# Patient Record
Sex: Male | Born: 1970 | Hispanic: Yes | Marital: Married | Smoking: Never smoker
Health system: Southern US, Community
[De-identification: ages and names within clinical notes are randomized; demographics above are authoritative.]

## PROBLEM LIST (undated history)

## (undated) DIAGNOSIS — M109 Gout, unspecified: Secondary | ICD-10-CM

## (undated) DIAGNOSIS — I10 Essential (primary) hypertension: Secondary | ICD-10-CM

## (undated) DIAGNOSIS — E785 Hyperlipidemia, unspecified: Secondary | ICD-10-CM

## (undated) DIAGNOSIS — E119 Type 2 diabetes mellitus without complications: Secondary | ICD-10-CM

## (undated) HISTORY — PX: HERNIA REPAIR: SHX51

---

## 2017-02-22 ENCOUNTER — Encounter (HOSPITAL_BASED_OUTPATIENT_CLINIC_OR_DEPARTMENT_OTHER): Payer: Self-pay | Admitting: *Deleted

## 2017-02-22 ENCOUNTER — Observation Stay (HOSPITAL_BASED_OUTPATIENT_CLINIC_OR_DEPARTMENT_OTHER)
Admission: EM | Admit: 2017-02-22 | Discharge: 2017-02-24 | Disposition: A | Discharge status: Home / Self Care | Payer: Medicare Other | Attending: Internal Medicine | Admitting: Internal Medicine

## 2017-02-22 ENCOUNTER — Emergency Department (HOSPITAL_BASED_OUTPATIENT_CLINIC_OR_DEPARTMENT_OTHER): Payer: Medicare Other

## 2017-02-22 DIAGNOSIS — K4030 Unilateral inguinal hernia, with obstruction, without gangrene, not specified as recurrent: Secondary | ICD-10-CM | POA: Diagnosis not present

## 2017-02-22 DIAGNOSIS — N10 Acute tubulo-interstitial nephritis: Secondary | ICD-10-CM | POA: Diagnosis present

## 2017-02-22 DIAGNOSIS — Z79899 Other long term (current) drug therapy: Secondary | ICD-10-CM | POA: Diagnosis not present

## 2017-02-22 DIAGNOSIS — I2510 Atherosclerotic heart disease of native coronary artery without angina pectoris: Secondary | ICD-10-CM | POA: Diagnosis not present

## 2017-02-22 DIAGNOSIS — N136 Pyonephrosis: Secondary | ICD-10-CM | POA: Diagnosis not present

## 2017-02-22 DIAGNOSIS — T83592A Infection and inflammatory reaction due to indwelling ureteral stent, initial encounter: Secondary | ICD-10-CM | POA: Diagnosis not present

## 2017-02-22 DIAGNOSIS — R109 Unspecified abdominal pain: Secondary | ICD-10-CM | POA: Diagnosis present

## 2017-02-22 DIAGNOSIS — N281 Cyst of kidney, acquired: Secondary | ICD-10-CM | POA: Insufficient documentation

## 2017-02-22 DIAGNOSIS — Y848 Other medical procedures as the cause of abnormal reaction of the patient, or of later complication, without mention of misadventure at the time of the procedure: Secondary | ICD-10-CM | POA: Diagnosis not present

## 2017-02-22 DIAGNOSIS — N179 Acute kidney failure, unspecified: Secondary | ICD-10-CM | POA: Diagnosis not present

## 2017-02-22 DIAGNOSIS — Z7902 Long term (current) use of antithrombotics/antiplatelets: Secondary | ICD-10-CM | POA: Diagnosis not present

## 2017-02-22 DIAGNOSIS — E119 Type 2 diabetes mellitus without complications: Secondary | ICD-10-CM | POA: Diagnosis not present

## 2017-02-22 DIAGNOSIS — E785 Hyperlipidemia, unspecified: Secondary | ICD-10-CM | POA: Diagnosis not present

## 2017-02-22 DIAGNOSIS — N2889 Other specified disorders of kidney and ureter: Principal | ICD-10-CM | POA: Insufficient documentation

## 2017-02-22 DIAGNOSIS — N12 Tubulo-interstitial nephritis, not specified as acute or chronic: Secondary | ICD-10-CM

## 2017-02-22 DIAGNOSIS — I10 Essential (primary) hypertension: Secondary | ICD-10-CM | POA: Diagnosis not present

## 2017-02-22 DIAGNOSIS — M109 Gout, unspecified: Secondary | ICD-10-CM | POA: Insufficient documentation

## 2017-02-22 DIAGNOSIS — Z794 Long term (current) use of insulin: Secondary | ICD-10-CM | POA: Insufficient documentation

## 2017-02-22 HISTORY — DX: Gout, unspecified: M10.9

## 2017-02-22 HISTORY — DX: Type 2 diabetes mellitus without complications: E11.9

## 2017-02-22 HISTORY — DX: Essential (primary) hypertension: I10

## 2017-02-22 HISTORY — DX: Hyperlipidemia, unspecified: E78.5

## 2017-02-22 LAB — CBC WITH DIFFERENTIAL/PLATELET
BASOS ABS: 0 10*3/uL (ref 0.0–0.1)
BASOS PCT: 0 %
EOS ABS: 0.1 10*3/uL (ref 0.0–0.7)
EOS PCT: 1 %
HCT: 37.9 % — ABNORMAL LOW (ref 39.0–52.0)
Hemoglobin: 12.2 g/dL — ABNORMAL LOW (ref 13.0–17.0)
Lymphocytes Relative: 34 %
Lymphs Abs: 3 10*3/uL (ref 0.7–4.0)
MCH: 28.6 pg (ref 26.0–34.0)
MCHC: 32.2 g/dL (ref 30.0–36.0)
MCV: 89 fL (ref 78.0–100.0)
MONO ABS: 0.6 10*3/uL (ref 0.1–1.0)
Monocytes Relative: 6 %
Neutro Abs: 5.2 10*3/uL (ref 1.7–7.7)
Neutrophils Relative %: 59 %
Platelets: 153 10*3/uL (ref 150–400)
RBC: 4.26 MIL/uL (ref 4.22–5.81)
RDW: 13.1 % (ref 11.5–15.5)
WBC: 8.8 10*3/uL (ref 4.0–10.5)

## 2017-02-22 LAB — BASIC METABOLIC PANEL
ANION GAP: 9 (ref 5–15)
BUN: 32 mg/dL — ABNORMAL HIGH (ref 6–20)
CALCIUM: 8.9 mg/dL (ref 8.9–10.3)
CHLORIDE: 105 mmol/L (ref 101–111)
CO2: 26 mmol/L (ref 22–32)
Creatinine, Ser: 1.38 mg/dL — ABNORMAL HIGH (ref 0.61–1.24)
GFR calc Af Amer: >60 mL/min (ref >60)
GFR calc non Af Amer: 60 mL/min — ABNORMAL LOW (ref >60)
Glucose, Bld: 111 mg/dL — ABNORMAL HIGH (ref 65–99)
Potassium: 3.9 mmol/L (ref 3.5–5.1)
Sodium: 140 mmol/L (ref 135–145)

## 2017-02-22 LAB — URINALYSIS, ROUTINE W REFLEX MICROSCOPIC
Bilirubin Urine: NEGATIVE
Glucose, UA: NEGATIVE mg/dL
Ketones, ur: NEGATIVE mg/dL
Nitrite: POSITIVE — AB
PROTEIN: NEGATIVE mg/dL
SPECIFIC GRAVITY, URINE: 1.017 (ref 1.005–1.030)
pH: 6 (ref 5.0–8.0)

## 2017-02-22 LAB — URINALYSIS, MICROSCOPIC (REFLEX)

## 2017-02-22 MED ORDER — SODIUM CHLORIDE 0.9 % IV BOLUS (SEPSIS)
1000.0000 mL | Freq: Once | INTRAVENOUS | Status: AC
  Administered 2017-02-22: 1000 mL via INTRAVENOUS

## 2017-02-22 MED ORDER — FENTANYL CITRATE (PF) 100 MCG/2ML IJ SOLN
50.0000 ug | INTRAMUSCULAR | Status: DC | PRN
  Administered 2017-02-22: 50 ug via INTRAVENOUS

## 2017-02-22 MED ORDER — DEXTROSE 5 % IV SOLN
1.0000 g | Freq: Once | INTRAVENOUS | Status: AC
  Administered 2017-02-22: 1 g via INTRAVENOUS
  Filled 2017-02-22: qty 10

## 2017-02-22 MED ORDER — SODIUM CHLORIDE 0.9 % IV BOLUS (SEPSIS)
500.0000 mL | Freq: Once | INTRAVENOUS | Status: AC
  Administered 2017-02-23: 500 mL via INTRAVENOUS

## 2017-02-22 MED ORDER — FENTANYL CITRATE (PF) 100 MCG/2ML IJ SOLN
INTRAMUSCULAR | Status: AC
  Filled 2017-02-22: qty 2

## 2017-02-22 MED ORDER — KETOROLAC TROMETHAMINE 30 MG/ML IJ SOLN
30.0000 mg | Freq: Once | INTRAMUSCULAR | Status: AC
  Administered 2017-02-22: 30 mg via INTRAVENOUS
  Filled 2017-02-22: qty 1

## 2017-02-22 NOTE — ED Provider Notes (Signed)
[facility] Provider Note   CSN: [id number] Arrival date & time: 02/22/17  2142  By signing my name below, I, Nelwyn Salisbury, attest that this documentation has been prepared under the direction and in the presence of Tenlee Wollin, MD . Electronically Signed: Nelwyn Salisbury, Scribe. 02/22/2017. 11:12 PM.  History   Chief Complaint Chief Complaint  Patient presents with  . Flank Pain   The history is provided by the patient and a relative. The history is limited by a language barrier. No language interpreter was used.  Flank Pain  This is a new problem. The current episode started 2 days ago. The problem occurs constantly. The problem has not changed since onset.Exacerbated by: Palpation. Nothing relieves the symptoms. He has tried nothing for the symptoms. The treatment provided no relief.    HPI Comments:  Cory Copeland is a 46 y.o. male with pmhx of DM, HLD, and HTN who presents to the Emergency Department complaining of constant, moderate right-sided flank pain onset two days ago. His pain is worsened with palpation. He reports associated dysuria. No medications taken PTA. Denies any vomiting, diarrhea, constipation or fever.    Past Medical History:  Diagnosis Date  . Diabetes mellitus without complication (HCC)   . Gout   . Hyperlipidemia   . Hypertension     There are no active problems to display for this patient.   History reviewed. No pertinent surgical history.     Home Medications    Prior to Admission medications   Medication Sig Start Date End Date Taking? Authorizing Provider  ALLOPURINOL PO Take by mouth.   Yes Historical Provider, MD  citalopram (CELEXA) 40 MG tablet Take 40 mg by mouth daily.   Yes Historical Provider, MD  clopidogrel (PLAVIX) 75 MG tablet Take 75 mg by mouth daily.   Yes Historical Provider, MD  enalapril (VASOTEC) 20 MG tablet Take 20 mg by mouth daily.   Yes Historical Provider, MD  gemfibrozil (LOPID) 600 MG tablet  Take 600 mg by mouth 2 (two) times daily before a meal.   Yes Historical Provider, MD  glimepiride (AMARYL) 4 MG tablet Take 4 mg by mouth daily with breakfast.   Yes Historical Provider, MD  hydrochlorothiazide (HYDRODIURIL) 25 MG tablet Take 25 mg by mouth daily.   Yes Historical Provider, MD  indomethacin (INDOCIN) 50 MG capsule Take 50 mg by mouth 2 (two) times daily with a meal.   Yes Historical Provider, MD  insulin glargine (LANTUS) 100 UNIT/ML injection Inject 50 Units into the skin at bedtime.   Yes Historical Provider, MD  isosorbide dinitrate (ISORDIL) 30 MG tablet Take 30 mg by mouth 4 (four) times daily.   Yes Historical Provider, MD  metformin (FORTAMET) 1000 MG (OSM) 24 hr tablet Take 1,000 mg by mouth daily with breakfast.   Yes Historical Provider, MD  metoprolol (LOPRESSOR) 50 MG tablet Take 50 mg by mouth 2 (two) times daily.   Yes Historical Provider, MD  temazepam (RESTORIL) 15 MG capsule Take 30 mg by mouth at bedtime as needed for sleep.   Yes Historical Provider, MD    Family History History reviewed. No pertinent family history.  Social History Social History  Substance Use Topics  . Smoking status: Never Smoker  . Smokeless tobacco: Not on file  . Alcohol use No     Allergies   Patient has no known allergies.   Review of Systems Review of Systems  Constitutional: Negative for fever.  Gastrointestinal: Negative  for constipation, diarrhea and vomiting.  Genitourinary: Positive for flank pain. Negative for dysuria, enuresis and frequency.  All other systems reviewed and are negative.    Physical Exam Updated Vital Signs BP (!) 157/82 (BP Location: Left Arm)   Pulse 92   Temp 97.6 F (36.4 C) (Oral)   Resp 18   Ht  (1.702 m)   Wt 262 lb (118.8 kg)   SpO2 96%   BMI 41.04 kg/m   Physical Exam  Constitutional: He is oriented to person, place, and time. He appears well-developed and well-nourished. No distress.  HENT:  Head: Normocephalic and  atraumatic.  Mouth/Throat: Oropharynx is clear and moist. No oropharyngeal exudate.  Moist mucous membranes   Eyes: Conjunctivae are normal. Pupils are equal, round, and reactive to light.  Neck: Normal range of motion. Neck supple. Carotid bruit is not present.  Trachea midline No bruit  Cardiovascular: Normal rate, regular rhythm, normal heart sounds and intact distal pulses.   Pulmonary/Chest: Effort normal and breath sounds normal. No stridor. No respiratory distress.  Abdominal: Soft. Bowel sounds are normal. He exhibits no distension and no mass. There is no tenderness. There is no rebound and no guarding.  Musculoskeletal: Normal range of motion. He exhibits no edema.  Neurological: He is alert and oriented to person, place, and time. He has normal reflexes.  Skin: Skin is warm and dry. Capillary refill takes less than 2 seconds.  Psychiatric: He has a normal mood and affect. His behavior is normal.  Nursing note and vitals reviewed.   ED Treatments / Results   Vitals:   02/23/17 0200 02/23/17 0245  BP: 126/72 140/68  Pulse: 71 73  Resp:    Temp:     Results for orders placed or performed during the hospital encounter of 02/22/17  Urinalysis, Routine w reflex microscopic  Result Value Ref Range   Color, Urine YELLOW YELLOW   APPearance CLOUDY (A) CLEAR   Specific Gravity, Urine 1.017 1.005 - 1.030   pH 6.0 5.0 - 8.0   Glucose, UA NEGATIVE NEGATIVE mg/dL   Hgb urine dipstick SMALL (A) NEGATIVE   Bilirubin Urine NEGATIVE NEGATIVE   Ketones, ur NEGATIVE NEGATIVE mg/dL   Protein, ur NEGATIVE NEGATIVE mg/dL   Nitrite POSITIVE (A) NEGATIVE   Leukocytes, UA LARGE (A) NEGATIVE  Urinalysis, Microscopic (reflex)  Result Value Ref Range   RBC / HPF 0-5 0 - 5 RBC/hpf   WBC, UA TOO NUMEROUS TO COUNT 0 - 5 WBC/hpf   Bacteria, UA FEW (A) NONE SEEN   Squamous Epithelial / LPF 0-5 (A) NONE SEEN  CBC with Differential/Platelet  Result Value Ref Range   WBC 8.8 4.0 - 10.5 K/uL    RBC 4.26 4.22 - 5.81 MIL/uL   Hemoglobin 12.2 (L) 13.0 - 17.0 g/dL   HCT 40.9 (L) 81.1 - 91.4 %   MCV 89.0 78.0 - 100.0 fL   MCH 28.6 26.0 - 34.0 pg   MCHC 32.2 30.0 - 36.0 g/dL   RDW 78.2 95.6 - 21.3 %   Platelets 153 150 - 400 K/uL   Neutrophils Relative % 59 %   Neutro Abs 5.2 1.7 - 7.7 K/uL   Lymphocytes Relative 34 %   Lymphs Abs 3.0 0.7 - 4.0 K/uL   Monocytes Relative 6 %   Monocytes Absolute 0.6 0.1 - 1.0 K/uL   Eosinophils Relative 1 %   Eosinophils Absolute 0.1 0.0 - 0.7 K/uL   Basophils Relative 0 %   Basophils  Absolute 0.0 0.0 - 0.1 K/uL  Basic metabolic panel  Result Value Ref Range   Sodium 140 135 - 145 mmol/L   Potassium 3.9 3.5 - 5.1 mmol/L   Chloride 105 101 - 111 mmol/L   CO2 26 22 - 32 mmol/L   Glucose, Bld 111 (H) 65 - 99 mg/dL   BUN 32 (H) 6 - 20 mg/dL   Creatinine, Ser 8.29 (H) 0.61 - 1.24 mg/dL   Calcium 8.9 8.9 - 56.2 mg/dL   GFR calc non Af Amer 60 (L) >60 mL/min   GFR calc Af Amer >60 >60 mL/min   Anion gap 9 5 - 15   Ct Renal Stone Study  Result Date: 02/22/2017 CLINICAL DATA:  Acute onset of right flank pain.  Initial encounter. EXAM: CT ABDOMEN AND PELVIS WITHOUT CONTRAST TECHNIQUE: Multidetector CT imaging of the abdomen and pelvis was performed following the standard protocol without IV contrast. COMPARISON:  None. FINDINGS: Lower chest: The visualized lung bases are grossly clear. The visualized portions of the mediastinum are unremarkable. Hepatobiliary: The liver is unremarkable in appearance. The gallbladder is unremarkable in appearance. The common bile duct remains normal in caliber. Pancreas: The pancreas is within normal limits. Spleen: The spleen is unremarkable in appearance. Adrenals/Urinary Tract: The adrenal glands are unremarkable in appearance. The kidneys are within normal limits. There is no evidence of hydronephrosis. No renal or ureteral stones are identified. No perinephric stranding is seen. Moderate right-sided renal atrophy is  noted, with underlying moderate chronic right-sided hydronephrosis. A right ureteral stent has migrated distally into a tortuous right ureter, with surrounding soft tissue inflammation. This results in stretching and bowing of the ureter, and likely causes the patient's symptoms. The distal aspect of the stent extends into the prostatic urethra. Mild nonspecific left-sided perinephric stranding is noted. A left renal cyst is identified. No nonobstructing renal stones are seen. Stomach/Bowel: The stomach is unremarkable in appearance. The small bowel is within normal limits. The appendix is normal in caliber, without evidence of appendicitis. The colon is unremarkable in appearance. Vascular/Lymphatic: The abdominal aorta is unremarkable in appearance. The inferior vena cava is grossly unremarkable. No retroperitoneal lymphadenopathy is seen. No pelvic sidewall lymphadenopathy is identified. Reproductive: The prostate remains normal in size. The bladder is grossly unremarkable in appearance. As mentioned above, the distal aspect of the patient's right ureteral stent extends into the prostatic urethra. Other: A moderate left inguinal hernia is seen, containing only fat. Musculoskeletal: No acute osseous abnormalities are identified. The visualized musculature is unremarkable in appearance. IMPRESSION: 1. Patient's right ureteral stent has migrated distally into a tortuous right ureter, with surrounding diffuse soft tissue inflammation. This has resulted in stretching and marked bowing of the ureter, and likely causes the patient's symptoms. The distal aspect of the stent now extends into the prostatic urethra. 2. Underlying moderate chronic right-sided hydronephrosis, with associated moderate right-sided renal atrophy. 3. Left renal cyst noted. 4. Moderate left inguinal hernia, containing only fat. These results were called by telephone at the time of interpretation on 02/22/2017 at 11:37 pm to Dr. Cy Blamer, who  verbally acknowledged these results. Electronically Signed   By: Roanna Raider M.D.   On: 02/22/2017 23:38     DIAGNOSTIC STUDIES:  Oxygen Saturation is 96% on RA, normal by my interpretation.    COORDINATION OF CARE:  11:08 PM Discussed treatment plan with pt at bedside and pt agreed to plan.  11:36 PM  Informed by radiology that the pt has an infected  ureteral stent  Radiology No results found.  Procedures Procedures (including critical care time)  Medications Ordered in ED  Medications  fentaNYL (SUBLIMAZE) injection 50 mcg (50 mcg Intravenous Given 02/22/17 2301)  tamsulosin (FLOMAX) capsule 0.4 mg (0.4 mg Oral Given 02/23/17 0151)  sodium chloride 0.9 % bolus 1,000 mL (0 mLs Intravenous Stopped 02/23/17 0032)  cefTRIAXone (ROCEPHIN) 1 g in dextrose 5 % 50 mL IVPB (0 g Intravenous Stopped 02/22/17 2334)  ketorolac (TORADOL) 30 MG/ML injection 30 mg (30 mg Intravenous Given 02/22/17 2358)  sodium chloride 0.9 % bolus 500 mL (0 mLs Intravenous Stopped 02/23/17 0101)    Case d/w Dr. Laverle Patter please admit to medicine they can consult, stent can be removed on a non emergent basis.    Final Clinical Impressions(s) / ED Diagnoses  Ureteritis infected stent: admit to medicine, Dr. Katrinka Blazing I personally performed the services described in this documentation, which was scribed in my presence. The recorded information has been reviewed and is accurate.       Cy Blamer, MD 02/23/17 [id number]

## 2017-02-22 NOTE — ED Triage Notes (Signed)
Pt c/o right flank pain x 2 days.  

## 2017-02-22 NOTE — ED Notes (Signed)
Patient transported to CT 

## 2017-02-23 ENCOUNTER — Encounter (HOSPITAL_COMMUNITY): Payer: Self-pay

## 2017-02-23 DIAGNOSIS — N12 Tubulo-interstitial nephritis, not specified as acute or chronic: Secondary | ICD-10-CM

## 2017-02-23 DIAGNOSIS — R109 Unspecified abdominal pain: Secondary | ICD-10-CM | POA: Diagnosis not present

## 2017-02-23 DIAGNOSIS — N390 Urinary tract infection, site not specified: Secondary | ICD-10-CM | POA: Insufficient documentation

## 2017-02-23 DIAGNOSIS — N10 Acute tubulo-interstitial nephritis: Secondary | ICD-10-CM | POA: Diagnosis present

## 2017-02-23 LAB — CBC
HCT: 36.8 % — ABNORMAL LOW (ref 39.0–52.0)
HEMOGLOBIN: 11.6 g/dL — AB (ref 13.0–17.0)
MCH: 27.2 pg (ref 26.0–34.0)
MCHC: 31.5 g/dL (ref 30.0–36.0)
MCV: 86.4 fL (ref 78.0–100.0)
Platelets: 122 10*3/uL — ABNORMAL LOW (ref 150–400)
RBC: 4.26 MIL/uL (ref 4.22–5.81)
RDW: 13.2 % (ref 11.5–15.5)
WBC: 5.4 10*3/uL (ref 4.0–10.5)

## 2017-02-23 LAB — HIV ANTIBODY (ROUTINE TESTING W REFLEX): HIV Screen 4th Generation wRfx: NONREACTIVE

## 2017-02-23 MED ORDER — SENNA 8.6 MG PO TABS
1.0000 | ORAL_TABLET | Freq: Two times a day (BID) | ORAL | Status: DC
  Administered 2017-02-23 (×2): 8.6 mg via ORAL
  Filled 2017-02-23 (×3): qty 1

## 2017-02-23 MED ORDER — ACETAMINOPHEN 650 MG RE SUPP: 650.0000 mg | Freq: Four times a day (QID) | RECTAL | Status: DC | PRN

## 2017-02-23 MED ORDER — ISOSORBIDE DINITRATE 30 MG PO TABS: 30.0000 mg | ORAL_TABLET | Freq: Four times a day (QID) | ORAL | Status: DC

## 2017-02-23 MED ORDER — GEMFIBROZIL 600 MG PO TABS
600.0000 mg | ORAL_TABLET | Freq: Two times a day (BID) | ORAL | Status: DC
  Administered 2017-02-23 – 2017-02-24 (×3): 600 mg via ORAL
  Filled 2017-02-23 (×3): qty 1

## 2017-02-23 MED ORDER — ONDANSETRON HCL 4 MG PO TABS: 4.0000 mg | ORAL_TABLET | Freq: Four times a day (QID) | ORAL | Status: DC | PRN

## 2017-02-23 MED ORDER — MORPHINE SULFATE (PF) 4 MG/ML IV SOLN: 1.0000 mg | INTRAVENOUS | Status: DC | PRN

## 2017-02-23 MED ORDER — METOPROLOL TARTRATE 50 MG PO TABS
50.0000 mg | ORAL_TABLET | Freq: Two times a day (BID) | ORAL | Status: DC
  Administered 2017-02-23 – 2017-02-24 (×3): 50 mg via ORAL
  Filled 2017-02-23 (×3): qty 1

## 2017-02-23 MED ORDER — ALLOPURINOL 100 MG PO TABS
100.0000 mg | ORAL_TABLET | Freq: Every day | ORAL | Status: DC
Start: 2017-02-23 — End: 2017-02-24
  Administered 2017-02-23 – 2017-02-24 (×2): 100 mg via ORAL
  Filled 2017-02-23 (×2): qty 1

## 2017-02-23 MED ORDER — CITALOPRAM HYDROBROMIDE 20 MG PO TABS
40.0000 mg | ORAL_TABLET | Freq: Every day | ORAL | Status: DC
  Administered 2017-02-23 – 2017-02-24 (×2): 40 mg via ORAL
  Filled 2017-02-23 (×2): qty 2

## 2017-02-23 MED ORDER — HYDROCODONE-ACETAMINOPHEN 5-325 MG PO TABS
1.0000 | ORAL_TABLET | ORAL | Status: DC | PRN
  Administered 2017-02-23: 2 via ORAL
  Filled 2017-02-23: qty 2

## 2017-02-23 MED ORDER — ISOSORBIDE DINITRATE 30 MG PO TABS
30.0000 mg | ORAL_TABLET | Freq: Two times a day (BID) | ORAL | Status: DC
  Administered 2017-02-23 – 2017-02-24 (×3): 30 mg via ORAL
  Filled 2017-02-23 (×4): qty 1

## 2017-02-23 MED ORDER — TAMSULOSIN HCL 0.4 MG PO CAPS
0.4000 mg | ORAL_CAPSULE | Freq: Every day | ORAL | Status: DC
  Administered 2017-02-23 – 2017-02-24 (×3): 0.4 mg via ORAL
  Filled 2017-02-23 (×3): qty 1

## 2017-02-23 MED ORDER — ENOXAPARIN SODIUM 40 MG/0.4ML ~~LOC~~ SOLN
40.0000 mg | SUBCUTANEOUS | Status: DC
  Administered 2017-02-23 – 2017-02-24 (×2): 40 mg via SUBCUTANEOUS
  Filled 2017-02-23 (×2): qty 0.4

## 2017-02-23 MED ORDER — INSULIN GLARGINE 100 UNIT/ML ~~LOC~~ SOLN
30.0000 [IU] | Freq: Every day | SUBCUTANEOUS | Status: DC
  Administered 2017-02-23: 30 [IU] via SUBCUTANEOUS
  Filled 2017-02-23 (×2): qty 0.3

## 2017-02-23 MED ORDER — TEMAZEPAM 15 MG PO CAPS: 30.0000 mg | ORAL_CAPSULE | Freq: Every evening | ORAL | Status: DC | PRN

## 2017-02-23 MED ORDER — ONDANSETRON HCL 4 MG/2ML IJ SOLN: 4.0000 mg | Freq: Four times a day (QID) | INTRAMUSCULAR | Status: DC | PRN

## 2017-02-23 MED ORDER — ACETAMINOPHEN 325 MG PO TABS: 650.0000 mg | ORAL_TABLET | Freq: Four times a day (QID) | ORAL | Status: DC | PRN

## 2017-02-23 MED ORDER — DOCUSATE SODIUM 100 MG PO CAPS
100.0000 mg | ORAL_CAPSULE | Freq: Two times a day (BID) | ORAL | Status: DC
  Administered 2017-02-23 – 2017-02-24 (×3): 100 mg via ORAL
  Filled 2017-02-23 (×3): qty 1

## 2017-02-23 MED ORDER — SODIUM CHLORIDE 0.9 % IV SOLN
INTRAVENOUS | Status: DC
  Administered 2017-02-23: 100 mL/h via INTRAVENOUS
  Administered 2017-02-23 – 2017-02-24 (×2): via INTRAVENOUS

## 2017-02-23 MED ORDER — DEXTROSE 5 % IV SOLN
2.0000 g | INTRAVENOUS | Status: DC
  Administered 2017-02-23 – 2017-02-24 (×2): 2 g via INTRAVENOUS
  Filled 2017-02-23 (×2): qty 2

## 2017-02-23 NOTE — Progress Notes (Addendum)
Mr. Cory Copeland is a 46 year old immigrant with pmh HTN, DM type 2, nephrolithiasis; who presented with right flank pain x 2 days. VS stable. BUN 32, Cr 1.38. UA +. CT abd shows displaced right ureteral stent with hydronephrosis. Patient placed on IV Rocephin.  Neurology called, but recommended treatment of an infection first and to consult in a.m. Transferring to MedSurg bed for treatment of pyelonephritis.

## 2017-02-23 NOTE — H&P (Signed)
History and Physical    [name] EYC:[id number] DOB: 1970/11/21 DOA: 02/22/2017  PCP: Pcp Not In System  Patient coming from:   I have personally briefly reviewed patient's old medical records in [facility]  Chief Complaint: right side flank pain, dysuria.   HPI: Cory Copeland is a 46 y.o. male with medical history significant of HTN, DM, Nephrolithiasis , prior ureter stent placement in [place] after a trauma to his kidney ? ( Kidney had dropped down and stent was placed to back it up), who presents complaining of flank pain, sharp I quality. 8/10, intermittent. He also report dysuria. Last dose of plavix was 4-13.  ED Course: WBC normal, afebrile, UA with : Too numerous to count WBC, small hemoglobin, positive nitrates, Cr at 1.3.   Review of Systems: As per HPI otherwise 10 point review of systems negative.    Past Medical History:  Diagnosis Date  . Diabetes mellitus without complication (HCC)   . Gout   . Hyperlipidemia   . Hypertension     PSH;  Stent placement ureter.  Inguinal hernia repair.    reports that he has never smoked. He does not have any smokeless tobacco history on file. He reports that he does not drink alcohol or use drugs.  No Known Allergies  Family History;  Parents deceased. History of HTN, DM.   Prior to Admission medications   Medication Sig Start Date End Date Taking? Authorizing Provider  allopurinol (ZYLOPRIM) 100 MG tablet Take 100 mg by mouth daily.   Yes Historical Provider, MD  citalopram (CELEXA) 40 MG tablet Take 40 mg by mouth daily.   Yes Historical Provider, MD  clopidogrel (PLAVIX) 75 MG tablet Take 75 mg by mouth daily.   Yes Historical Provider, MD  enalapril (VASOTEC) 20 MG tablet Take 20 mg by mouth daily.   Yes Historical Provider, MD  gemfibrozil (LOPID) 600 MG tablet Take 600 mg by mouth 2 (two) times daily before a meal.   Yes Historical Provider, MD  glimepiride (AMARYL) 4 MG tablet Take 4 mg by mouth daily  with breakfast.   Yes Historical Provider, MD  hydrochlorothiazide (HYDRODIURIL) 25 MG tablet Take 25 mg by mouth daily.   Yes Historical Provider, MD  indomethacin (INDOCIN) 50 MG capsule Take 50 mg by mouth 2 (two) times daily with a meal.   Yes Historical Provider, MD  insulin glargine (LANTUS) 100 UNIT/ML injection Inject 50 Units into the skin at bedtime.   Yes Historical Provider, MD  isosorbide dinitrate (ISORDIL) 30 MG tablet Take 30 mg by mouth 4 (four) times daily.   Yes Historical Provider, MD  metformin (FORTAMET) 1000 MG (OSM) 24 hr tablet Take 1,000 mg by mouth daily with breakfast.   Yes Historical Provider, MD  metoprolol (LOPRESSOR) 50 MG tablet Take 50 mg by mouth 2 (two) times daily.   Yes Historical Provider, MD  temazepam (RESTORIL) 30 MG capsule Take 30 mg by mouth at bedtime as needed for sleep. 02/04/17  Yes Historical Provider, MD    Physical Exam: Vitals:   02/23/17 0153 02/23/17 0200 02/23/17 0245 02/23/17 0411  BP: 133/67 126/72 140/68 137/67  Pulse: 75 71 73 80  Resp: 16   16  Temp: 97.7 F (36.5 C)   97.9 F (36.6 C)  TempSrc: Oral   Oral  SpO2: 97% 96% 93% 94%  Weight:    122.5 kg (270 lb 1 oz)  Height:     (1.702 m)  Constitutional: NAD, calm, comfortable, obese Vitals:   02/23/17 0153 02/23/17 0200 02/23/17 0245 02/23/17 0411  BP: 133/67 126/72 140/68 137/67  Pulse: 75 71 73 80  Resp: 16   16  Temp: 97.7 F (36.5 C)   97.9 F (36.6 C)  TempSrc: Oral   Oral  SpO2: 97% 96% 93% 94%  Weight:    122.5 kg (270 lb 1 oz)  Height:     (1.702 m)   Eyes: PERRL, lids and conjunctivae normal ENMT: Mucous membranes are moist. Posterior pharynx clear of any exudate or lesions.Normal dentition.  Neck: normal, supple, no masses, no thyromegaly Respiratory: clear to auscultation bilaterally, no wheezing, no crackles. Normal respiratory effort. No accessory muscle use.  Cardiovascular: Regular rate and rhythm, no murmurs / rubs / gallops. No  extremity edema. 2+ pedal pulses. No carotid bruits.  Abdomen: no tenderness, no masses palpated. No hepatosplenomegaly. Bowel sounds positive. Flank tenderness.  Musculoskeletal: no clubbing / cyanosis. No joint deformity upper and lower extremities. Good ROM, no contractures. Normal muscle tone.  Skin: no rashes, lesions, ulcers. No induration Neurologic: CN 2-12 grossly intact. Sensation intact, DTR normal. Strength 5/5 in all 4.  Psychiatric: Normal judgment and insight. Alert and oriented x 3. Normal mood.     Labs on Admission: I have personally reviewed following labs and imaging studies  CBC:  Recent Labs Lab 02/22/17 2318  WBC 8.8  NEUTROABS 5.2  HGB 12.2*  HCT 37.9*  MCV 89.0  PLT 153   Basic Metabolic Panel:  Recent Labs Lab 02/22/17 2318  NA 140  K 3.9  CL 105  CO2 26  GLUCOSE 111*  BUN 32*  CREATININE 1.38*  CALCIUM 8.9   GFR: Estimated Creatinine Clearance: 83 mL/min (A) (by C-G formula based on SCr of 1.38 mg/dL (H)). Liver Function Tests: No results for input(s): AST, ALT, ALKPHOS, BILITOT, PROT, ALBUMIN in the last 168 hours. No results for input(s): LIPASE, AMYLASE in the last 168 hours. No results for input(s): AMMONIA in the last 168 hours. Coagulation Profile: No results for input(s): INR, PROTIME in the last 168 hours. Cardiac Enzymes: No results for input(s): CKTOTAL, CKMB, CKMBINDEX, TROPONINI in the last 168 hours. BNP (last 3 results) No results for input(s): PROBNP in the last 8760 hours. HbA1C: No results for input(s): HGBA1C in the last 72 hours. CBG: No results for input(s): GLUCAP in the last 168 hours. Lipid Profile: No results for input(s): CHOL, HDL, LDLCALC, TRIG, CHOLHDL, LDLDIRECT in the last 72 hours. Thyroid Function Tests: No results for input(s): TSH, T4TOTAL, FREET4, T3FREE, THYROIDAB in the last 72 hours. Anemia Panel: No results for input(s): VITAMINB12, FOLATE, FERRITIN, TIBC, IRON, RETICCTPCT in the last 72  hours. Urine analysis:    Component Value Date/Time   COLORURINE YELLOW 02/22/2017 2158   APPEARANCEUR CLOUDY (A) 02/22/2017 2158   LABSPEC 1.017 02/22/2017 2158   PHURINE 6.0 02/22/2017 2158   GLUCOSEU NEGATIVE 02/22/2017 2158   HGBUR SMALL (A) 02/22/2017 2158   BILIRUBINUR NEGATIVE 02/22/2017 2158   KETONESUR NEGATIVE 02/22/2017 2158   PROTEINUR NEGATIVE 02/22/2017 2158   NITRITE POSITIVE (A) 02/22/2017 2158   LEUKOCYTESUR LARGE (A) 02/22/2017 2158    Radiological Exams on Admission: Ct Renal Stone Study  Result Date: 02/22/2017 CLINICAL DATA:  Acute onset of right flank pain.  Initial encounter. EXAM: CT ABDOMEN AND PELVIS WITHOUT CONTRAST TECHNIQUE: Multidetector CT imaging of the abdomen and pelvis was performed following the standard protocol without IV contrast. COMPARISON:  None. FINDINGS:  Lower chest: The visualized lung bases are grossly clear. The visualized portions of the mediastinum are unremarkable. Hepatobiliary: The liver is unremarkable in appearance. The gallbladder is unremarkable in appearance. The common bile duct remains normal in caliber. Pancreas: The pancreas is within normal limits. Spleen: The spleen is unremarkable in appearance. Adrenals/Urinary Tract: The adrenal glands are unremarkable in appearance. The kidneys are within normal limits. There is no evidence of hydronephrosis. No renal or ureteral stones are identified. No perinephric stranding is seen. Moderate right-sided renal atrophy is noted, with underlying moderate chronic right-sided hydronephrosis. A right ureteral stent has migrated distally into a tortuous right ureter, with surrounding soft tissue inflammation. This results in stretching and bowing of the ureter, and likely causes the patient's symptoms. The distal aspect of the stent extends into the prostatic urethra. Mild nonspecific left-sided perinephric stranding is noted. A left renal cyst is identified. No nonobstructing renal stones are seen.  Stomach/Bowel: The stomach is unremarkable in appearance. The small bowel is within normal limits. The appendix is normal in caliber, without evidence of appendicitis. The colon is unremarkable in appearance. Vascular/Lymphatic: The abdominal aorta is unremarkable in appearance. The inferior vena cava is grossly unremarkable. No retroperitoneal lymphadenopathy is seen. No pelvic sidewall lymphadenopathy is identified. Reproductive: The prostate remains normal in size. The bladder is grossly unremarkable in appearance. As mentioned above, the distal aspect of the patient's right ureteral stent extends into the prostatic urethra. Other: A moderate left inguinal hernia is seen, containing only fat. Musculoskeletal: No acute osseous abnormalities are identified. The visualized musculature is unremarkable in appearance. IMPRESSION: 1. Patient's right ureteral stent has migrated distally into a tortuous right ureter, with surrounding diffuse soft tissue inflammation. This has resulted in stretching and marked bowing of the ureter, and likely causes the patient's symptoms. The distal aspect of the stent now extends into the prostatic urethra. 2. Underlying moderate chronic right-sided hydronephrosis, with associated moderate right-sided renal atrophy. 3. Left renal cyst noted. 4. Moderate left inguinal hernia, containing only fat. These results were called by telephone at the time of interpretation on 02/22/2017 at 11:37 pm to Dr. Cy Blamer, who verbally acknowledged these results. Electronically Signed   By: Roanna Raider M.D.   On: 02/22/2017 23:38     Assessment/Plan Principal Problem:   Acute pyelonephritis   1-Acute pyelonephritis;  Admit to med-surgery.  IV fluids.  IV ceftriaxone.  Follow urine culture.   2-Right ureteral obstruction from retained right ureteral stent.  Urology consulted.  Patient underwent flexible cystoscopy and removal of right ureteral stent.   3-CAD;  Hold Plavix today In  anticipation of procedure.  Continue with metoprolol, Imdur, lopid.   4-DM; hold metformin, SSI.   5-AKI; related to obstructive uropathy, infection.   IV fluids. Urology consulted.  Hold diuretics and ACE>  Repeat labs in am.   DVT prophylaxis: Lovenox Code Status: Full code.   Family Communication: care discussed with patient.  Disposition Plan: home at time of discharge.  Consults called: Urology  Admission status: med-surgery, observation.    Alba Cory MD [facility] Pager [phone]  If 7PM-7AM, please contact night-coverage [email or web address] Password [email or web address]  02/23/2017, 7:20 AM

## 2017-02-23 NOTE — Progress Notes (Signed)
PHARMACY NOTE -  ANTIBIOTIC RENAL DOSE ADJUSTMENT   Request received for Pharmacy to assist with antibiotic renal dose adjustment.  Patient has been initiated on Ceftriaxone 2gm iv q24hr  for pyleonephritis. SCr 1.38, estimated CrCl 83 ml/min Current dosage is appropriate and need for further dosage adjustment appears unlikely at present. Will sign off at this time.  Please reconsult if a change in clinical status warrants re-evaluation of dosage.
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
## 2017-02-23 NOTE — ED Notes (Signed)
ED Provider at bedside discussing test results and plan of care. 

## 2017-02-24 DIAGNOSIS — N10 Acute tubulo-interstitial nephritis: Secondary | ICD-10-CM

## 2017-02-24 LAB — BASIC METABOLIC PANEL
ANION GAP: 8 (ref 5–15)
BUN: 19 mg/dL (ref 6–20)
CHLORIDE: 107 mmol/L (ref 101–111)
CO2: 26 mmol/L (ref 22–32)
Calcium: 8.2 mg/dL — ABNORMAL LOW (ref 8.9–10.3)
Creatinine, Ser: 0.67 mg/dL (ref 0.61–1.24)
GFR calc Af Amer: >60 mL/min (ref >60)
GLUCOSE: 137 mg/dL — AB (ref 65–99)
POTASSIUM: 4.2 mmol/L (ref 3.5–5.1)
Sodium: 141 mmol/L (ref 135–145)

## 2017-02-24 LAB — CBC
HEMATOCRIT: 36.4 % — AB (ref 39.0–52.0)
HEMOGLOBIN: 11.3 g/dL — AB (ref 13.0–17.0)
MCH: 27.8 pg (ref 26.0–34.0)
MCHC: 31 g/dL (ref 30.0–36.0)
MCV: 89.7 fL (ref 78.0–100.0)
Platelets: 126 10*3/uL — ABNORMAL LOW (ref 150–400)
RBC: 4.06 MIL/uL — ABNORMAL LOW (ref 4.22–5.81)
RDW: 13.2 % (ref 11.5–15.5)
WBC: 6.9 10*3/uL (ref 4.0–10.5)

## 2017-02-24 MED ORDER — CEPHALEXIN 500 MG PO CAPS: 500.0000 mg | ORAL_CAPSULE | Freq: Two times a day (BID) | ORAL | 0 refills | Status: AC

## 2017-02-24 MED ORDER — TAMSULOSIN HCL 0.4 MG PO CAPS: 0.4000 mg | ORAL_CAPSULE | Freq: Every day | ORAL | 0 refills | Status: AC

## 2017-02-24 NOTE — Progress Notes (Signed)
Reviewed discharge information with patient and caregiver. Answered all questions. Patient/caregiver able to teach back medications and reasons to contact MD/911. Patient verbalizes importance of urology follow up appointment. *Interpreter was used[employer]. Clelia Croft, RN

## 2017-02-24 NOTE — Progress Notes (Signed)
Faxed copy of insurance cards to admissions. Isidoro Donning RN CCM Case Mgmt phone [phone]

## 2017-02-24 NOTE — Progress Notes (Signed)
Patient ID: Cory Copeland, male   DOB: 04-03-1971, 46 y.o.   MRN: [id number]   Subjective: Pt denies flank pain since stent removal.  Voiding well.  Objective: Vital signs in last 24 hours: Temp:  [97.7 F (36.5 C)-98 F (36.7 C)] 97.8 F (36.6 C) (04/15 0500) Pulse Rate:  [63-85] 63 (04/15 0500) Resp:  [16-18] 16 (04/15 0500) BP: (123-142)/(65-76) 127/76 (04/15 0500) SpO2:  [94 %-95 %] 94 % (04/15 0500)  Intake/Output from previous day: 04/14 0701 - 04/15 0700 In: 2395 [I.V.:2345; IV Piggyback:50] Out: -  Intake/Output this shift: No intake/output data recorded.  Physical Exam:  General: Alert and oriented Abd: No CVAT  Lab Results:  Recent Labs  02/22/17 2318 02/23/17 0740 02/24/17 0451  HGB 12.2* 11.6* 11.3*  HCT 37.9* 36.8* 36.4*   BMET  Recent Labs  02/22/17 2318 02/24/17 0451  NA 140 141  K 3.9 4.2  CL 105 107  CO2 26 26  GLUCOSE 111* 137*  BUN 32* 19  CREATININE 1.38* 0.67  CALCIUM 8.9 8.2*     Studies/Results:   Assessment/Plan: Ok to d/c home from urologic standpoint.  Will arrange f/u in 3-4 weeks with renal ultrasound to ensure resolution of hydronephrosis.   LOS: 0 days   Abbrielle Batts,LES 02/24/2017, 9:45 AM

## 2017-02-24 NOTE — Care Management Obs Status (Signed)
MEDICARE OBSERVATION STATUS NOTIFICATION   Patient Details  Name: Cory Copeland MRN: [id number] Date of Birth: 11-12-1971   Medicare Observation Status Notification Given:  Yes    Elliot Cousin, RN 02/24/2017, 12:32 PM

## 2017-02-24 NOTE — Discharge Summary (Signed)
Cory Copeland, is a 46 y.o. male  DOB 1970-12-01  MRN [id number].  Admission date:  02/22/2017  Admitting Physician  Clydie Braun, MD  Discharge Date:  02/24/2017   Primary MD  Pcp Not In System  Recommendations for primary care physician for things to follow:  Final Culture reports CBC BMP   Admission Diagnosis  Ureteritis [N28.89] Pyelonephritis [N12]   Discharge Diagnosis  Ureteritis [N28.89] Pyelonephritis [N12]   Principal Problem:   Acute pyelonephritis Active Problems:   Flank pain      Past Medical History:  Diagnosis Date  . Diabetes mellitus without complication (HCC)   . Gout   . Hyperlipidemia   . Hypertension     History reviewed. No pertinent surgical history.     HPI  from the history and physical done on the day of admission:  Cory Copeland is a 46 y.o. male with medical history significant of HTN, DM, Nephrolithiasis , prior ureter stent placement in [place] after a trauma to his kidney ? ( Kidney had dropped down and stent was placed to back it up), who presents complaining of flank pain, sharp I quality. 8/10, intermittent. He also report dysuria. Last dose of plavix was 4-13.  ED Course: WBC normal, afebrile, UA with : Too numerous to count WBC, small hemoglobin, positive nitrates, Cr at 1.3.         Hospital Course:   Cory Copeland is a 46 y.o. Gentleman who presented with right flank pain and right lower abdominal pain of 3 days' duration without fever or chills.  He fell on his right flank while on some rocks.  He was taken to the hospital in [place] where he lived and they placed a right ureteral stent because his "kidney had dropped down and they needed the stent to prop it back up".    At the ED his right ureteral stent was noted to have migrated into the mid/distal right ureter with the distal curl in the prostatic urethra with ipsilateral  hydronephrosis. He is on Plavix for unclear reasons.   He was seen in consultation by urology who recommended pain control and treatment with antibiotics for UTI. He underwent flexible cystoscopy with removal of his right urethral stent without complications on 02/23/2017 by Dr. Laverle Patter.      Discharge Condition: Stable  Follow UP  Follow-up Information    [facility] Follow up.   Why:  3-4 weeks for renal ultrasound Contact information: [address] [place] [address] [phone]           Consults obtained - Urology, Dr Laverle Patter Diet and Activity recommendation:  As advised  Discharge Instructions     Discharge Instructions    Call MD for:  difficulty breathing, headache or visual disturbances    Complete by:  As directed    Call MD for:  extreme fatigue    Complete by:  As directed    Call MD for:  hives  Complete by:  As directed    Call MD for:  persistant dizziness or light-headedness    Complete by:  As directed    Call MD for:  persistant nausea and vomiting    Complete by:  As directed    Call MD for:  redness, tenderness, or signs of infection (pain, swelling, redness, odor or green/yellow discharge around incision site)    Complete by:  As directed    Call MD for:  severe uncontrolled pain    Complete by:  As directed    Call MD for:  temperature >100.4    Complete by:  As directed    Diet - low sodium heart healthy    Complete by:  As directed    Discharge instructions    Complete by:  As directed    Follow-up with pcp in 1-2 weeks   Increase activity slowly    Complete by:  As directed         Discharge Medications     Allergies as of 02/24/2017   No Known Allergies     Medication List    STOP taking these medications   temazepam 30 MG capsule Commonly known as:  RESTORIL     TAKE these medications   allopurinol 300 MG tablet Commonly known as:  ZYLOPRIM Take 300 mg by mouth daily.     amLODipine 5 MG tablet Commonly known as:  NORVASC Take 5 mg by mouth daily.   atorvastatin 10 MG tablet Commonly known as:  LIPITOR Take 10 mg by mouth daily.   cephALEXin 500 MG capsule Commonly known as:  KEFLEX Take 1 capsule (500 mg total) by mouth 2 (two) times daily.   citalopram 20 MG tablet Commonly known as:  CELEXA Take 20 mg by mouth daily.   clopidogrel 75 MG tablet Commonly known as:  PLAVIX Take 75 mg by mouth daily.   enalapril 20 MG tablet Commonly known as:  VASOTEC Take 20 mg by mouth daily.   gemfibrozil 600 MG tablet Commonly known as:  LOPID Take 600 mg by mouth 2 (two) times daily before a meal.   glimepiride 4 MG tablet Commonly known as:  AMARYL Take 4 mg by mouth daily with breakfast.   hydrochlorothiazide 50 MG tablet Commonly known as:  HYDRODIURIL Take 50 mg by mouth daily.   indomethacin 50 MG capsule Commonly known as:  INDOCIN Take 50 mg by mouth 2 (two) times daily with a meal.   insulin glargine 100 UNIT/ML injection Commonly known as:  LANTUS Inject 30 Units into the skin at bedtime.   isosorbide mononitrate 30 MG 24 hr tablet Commonly known as:  IMDUR Take 30 mg by mouth daily.   metFORMIN 1000 MG tablet Commonly known as:  GLUCOPHAGE Take 1,000 mg by mouth 2 (two) times daily with a meal.   metoprolol 50 MG tablet Commonly known as:  LOPRESSOR Take 50 mg by mouth 2 (two) times daily.   tamsulosin 0.4 MG Caps capsule Commonly known as:  FLOMAX Take 1 capsule (0.4 mg total) by mouth daily. Start taking on:  02/25/2017   traZODone 100 MG tablet Commonly known as:  DESYREL Take 100 mg by mouth at bedtime.       Major procedures and Radiology Reports -  flexible cystoscopy with removal of his right ureteral stent - Dr Laverle Patter, 02/23/17  Ct Renal Stone Study  Result Date: 02/22/2017 CLINICAL DATA:  Acute onset of right flank pain.  Initial encounter. EXAM: CT  ABDOMEN AND PELVIS WITHOUT CONTRAST TECHNIQUE:  Multidetector CT imaging of the abdomen and pelvis was performed following the standard protocol without IV contrast. COMPARISON:  None. FINDINGS: Lower chest: The visualized lung bases are grossly clear. The visualized portions of the mediastinum are unremarkable. Hepatobiliary: The liver is unremarkable in appearance. The gallbladder is unremarkable in appearance. The common bile duct remains normal in caliber. Pancreas: The pancreas is within normal limits. Spleen: The spleen is unremarkable in appearance. Adrenals/Urinary Tract: The adrenal glands are unremarkable in appearance. The kidneys are within normal limits. There is no evidence of hydronephrosis. No renal or ureteral stones are identified. No perinephric stranding is seen. Moderate right-sided renal atrophy is noted, with underlying moderate chronic right-sided hydronephrosis. A right ureteral stent has migrated distally into a tortuous right ureter, with surrounding soft tissue inflammation. This results in stretching and bowing of the ureter, and likely causes the patient's symptoms. The distal aspect of the stent extends into the prostatic urethra. Mild nonspecific left-sided perinephric stranding is noted. A left renal cyst is identified. No nonobstructing renal stones are seen. Stomach/Bowel: The stomach is unremarkable in appearance. The small bowel is within normal limits. The appendix is normal in caliber, without evidence of appendicitis. The colon is unremarkable in appearance. Vascular/Lymphatic: The abdominal aorta is unremarkable in appearance. The inferior vena cava is grossly unremarkable. No retroperitoneal lymphadenopathy is seen. No pelvic sidewall lymphadenopathy is identified. Reproductive: The prostate remains normal in size. The bladder is grossly unremarkable in appearance. As mentioned above, the distal aspect of the patient's right ureteral stent extends into the prostatic urethra. Other: A moderate left inguinal hernia is seen,  containing only fat. Musculoskeletal: No acute osseous abnormalities are identified. The visualized musculature is unremarkable in appearance. IMPRESSION: 1. Patient's right ureteral stent has migrated distally into a tortuous right ureter, with surrounding diffuse soft tissue inflammation. This has resulted in stretching and marked bowing of the ureter, and likely causes the patient's symptoms. The distal aspect of the stent now extends into the prostatic urethra. 2. Underlying moderate chronic right-sided hydronephrosis, with associated moderate right-sided renal atrophy. 3. Left renal cyst noted. 4. Moderate left inguinal hernia, containing only fat. These results were called by telephone at the time of interpretation on 02/22/2017 at 11:37 pm to Dr. Cy Blamer, who verbally acknowledged these results. Electronically Signed   By: Roanna Raider M.D.   On: 02/22/2017 23:38    Micro Results     No results found for this or any previous visit (from the past 240 hour(s)).     Today   Subjective    Cory Copeland today has no Fever or chills. Denies any abdominal pain or flank pain.          Patient has been seen and examined prior to discharge   Objective   Blood pressure 127/76, pulse 63, temperature 97.8 F (36.6 C), temperature source Oral, resp. rate 16, height  (1.702 m), weight 122.5 kg (270 lb 1 oz), SpO2 94 %.   Intake/Output Summary (Last 24 hours) at 02/24/17 1016 Last data filed at 02/24/17 0551  Gross per 24 hour  Intake             2225 ml  Output                0 ml  Net             2225 ml    Exam Gen:- Awake Comfortable HEENT:- [place].AT,  MMM  Neck-Supple Neck,No JVD,  Lungs- CTA CV- S1, S2 normal Abd-  +ve B.Sounds, Abd Soft, No tenderness,    Extremity/Skin:- No edema no cyanosis   Data Review   CBC w Diff: Lab Results  Component Value Date   WBC 6.9 02/24/2017   HGB 11.3 (L) 02/24/2017   HCT 36.4 (L) 02/24/2017   PLT 126 (L) 02/24/2017    LYMPHOPCT 34 02/22/2017   MONOPCT 6 02/22/2017   EOSPCT 1 02/22/2017   BASOPCT 0 02/22/2017    CMP: Lab Results  Component Value Date   NA 141 02/24/2017   K 4.2 02/24/2017   CL 107 02/24/2017   CO2 26 02/24/2017   BUN 19 02/24/2017   CREATININE 0.67 02/24/2017  .   Total Discharge time is about 33 minutes  OSEI-BONSU,Ricquel Foulk M.D on 02/24/2017 at 10:16 AM  [facility]   Office  [phone]  Dragon dictation system was used to create this note, attempts have been made to correct errors, however presence of uncorrected errors is not a reflection quality of care provided

## 2017-03-01 ENCOUNTER — Encounter (HOSPITAL_BASED_OUTPATIENT_CLINIC_OR_DEPARTMENT_OTHER): Payer: Self-pay | Admitting: *Deleted

## 2017-03-01 ENCOUNTER — Emergency Department (HOSPITAL_BASED_OUTPATIENT_CLINIC_OR_DEPARTMENT_OTHER): Payer: Medicare Other

## 2017-03-01 ENCOUNTER — Emergency Department (HOSPITAL_BASED_OUTPATIENT_CLINIC_OR_DEPARTMENT_OTHER)
Admission: EM | Admit: 2017-03-01 | Discharge: 2017-03-01 | Disposition: A | Discharge status: Home / Self Care | Payer: Medicare Other | Attending: Emergency Medicine | Admitting: Emergency Medicine

## 2017-03-01 DIAGNOSIS — Z794 Long term (current) use of insulin: Secondary | ICD-10-CM | POA: Insufficient documentation

## 2017-03-01 DIAGNOSIS — R319 Hematuria, unspecified: Secondary | ICD-10-CM | POA: Diagnosis not present

## 2017-03-01 DIAGNOSIS — I10 Essential (primary) hypertension: Secondary | ICD-10-CM | POA: Insufficient documentation

## 2017-03-01 DIAGNOSIS — Z79899 Other long term (current) drug therapy: Secondary | ICD-10-CM | POA: Insufficient documentation

## 2017-03-01 DIAGNOSIS — R109 Unspecified abdominal pain: Secondary | ICD-10-CM

## 2017-03-01 DIAGNOSIS — R300 Dysuria: Secondary | ICD-10-CM | POA: Diagnosis not present

## 2017-03-01 DIAGNOSIS — E119 Type 2 diabetes mellitus without complications: Secondary | ICD-10-CM | POA: Diagnosis not present

## 2017-03-01 LAB — CBC WITH DIFFERENTIAL/PLATELET
Basophils Absolute: 0 10*3/uL (ref 0.0–0.1)
Basophils Relative: 0 %
EOS ABS: 0.1 10*3/uL (ref 0.0–0.7)
Eosinophils Relative: 2 %
HEMATOCRIT: 41 % (ref 39.0–52.0)
HEMOGLOBIN: 12.8 g/dL — AB (ref 13.0–17.0)
LYMPHS ABS: 2.3 10*3/uL (ref 0.7–4.0)
Lymphocytes Relative: 30 %
MCH: 27.9 pg (ref 26.0–34.0)
MCHC: 31.2 g/dL (ref 30.0–36.0)
MCV: 89.5 fL (ref 78.0–100.0)
MONO ABS: 0.5 10*3/uL (ref 0.1–1.0)
MONOS PCT: 7 %
Neutro Abs: 4.7 10*3/uL (ref 1.7–7.7)
Neutrophils Relative %: 61 %
Platelets: 147 10*3/uL — ABNORMAL LOW (ref 150–400)
RBC: 4.58 MIL/uL (ref 4.22–5.81)
RDW: 13.4 % (ref 11.5–15.5)
WBC: 7.6 10*3/uL (ref 4.0–10.5)

## 2017-03-01 LAB — BASIC METABOLIC PANEL
Anion gap: 9 (ref 5–15)
BUN: 32 mg/dL — AB (ref 6–20)
CHLORIDE: 103 mmol/L (ref 101–111)
CO2: 25 mmol/L (ref 22–32)
CREATININE: 0.88 mg/dL (ref 0.61–1.24)
Calcium: 9 mg/dL (ref 8.9–10.3)
GFR calc Af Amer: >60 mL/min (ref >60)
GFR calc non Af Amer: >60 mL/min (ref >60)
Glucose, Bld: 135 mg/dL — ABNORMAL HIGH (ref 65–99)
Potassium: 4.5 mmol/L (ref 3.5–5.1)
Sodium: 137 mmol/L (ref 135–145)

## 2017-03-01 LAB — URINALYSIS, ROUTINE W REFLEX MICROSCOPIC
Bilirubin Urine: NEGATIVE
Glucose, UA: NEGATIVE mg/dL
KETONES UR: NEGATIVE mg/dL
Nitrite: NEGATIVE
PH: 5.5 (ref 5.0–8.0)
Protein, ur: NEGATIVE mg/dL
SPECIFIC GRAVITY, URINE: 1.018 (ref 1.005–1.030)

## 2017-03-01 LAB — URINALYSIS, MICROSCOPIC (REFLEX)

## 2017-03-01 MED ORDER — KETOROLAC TROMETHAMINE 30 MG/ML IJ SOLN
30.0000 mg | Freq: Once | INTRAMUSCULAR | Status: AC
  Administered 2017-03-01: 30 mg via INTRAVENOUS
  Filled 2017-03-01: qty 1

## 2017-03-01 MED ORDER — IBUPROFEN 600 MG PO TABS: 600.0000 mg | ORAL_TABLET | Freq: Four times a day (QID) | ORAL | 0 refills | Status: AC | PRN

## 2017-03-01 MED ORDER — MORPHINE SULFATE (PF) 4 MG/ML IV SOLN
4.0000 mg | Freq: Once | INTRAVENOUS | Status: AC
  Administered 2017-03-01: 4 mg via INTRAVENOUS
  Filled 2017-03-01: qty 1

## 2017-03-01 MED FILL — IBUPROFEN 600 MG TABLET: 600 | 8 days supply | Qty: 30 | Fill #0

## 2017-03-01 NOTE — ED Notes (Signed)
Pt transported to CT via stretcher.  

## 2017-03-01 NOTE — ED Triage Notes (Signed)
Back pain. Bleeding from his penis x 2 days. He had surgery on his kidney 6 days ago.

## 2017-03-01 NOTE — ED Notes (Signed)
Pt had ureteral stent removed 6 days ago. 2 days ago developed back pain and hematuria. Pt reports he is urinating well but reports burning.

## 2017-03-01 NOTE — Discharge Instructions (Signed)
Your urinary tract infection does appear to be improving. Your blood work is reassuring and her CT scan does not show any other abnormalities. Take ibuprofen for pain control. Continue to take your antibiotics and finish them. Return without fail for worsening symptoms, including fever, intractable vomiting, escalating pain, or any other symptoms concerning to you

## 2017-03-01 NOTE — ED Provider Notes (Signed)
[facility] Provider Note   CSN: [id number] Arrival date & time: [id number]  1043     History   Chief Complaint Chief Complaint  Patient presents with  . Back Pain    HPI Cory Copeland is a 46 y.o. male.  HPI  46 year old male who presents with right flank pain. History provided by patient's sister who is acting as interpreter for him. I offered Spanish interpreter, but he has declined.  He has a history of diabetes, hypertension and hyperlipidemia. He was recently in [place] had associating trauma/fall. He was told that his right kidney was injured, and collapsing. Subsequent right ureteral stent was placed there to keep it in place. He was seen at [facility] emergency department on April 13 for worsening right flank pain. He had imaging suggested that his stent had migrated. He was admitted into the hospital with eventual right stent removal. Discharged on April 15. States that he was initially feeling well. About a day later after discharge had recurrent and worsening right flank pain that is constant. Has noted mild dysuria and frequency. Did notice blood in his underwear, but no gross hematuria with urination. Has not had fevers, chills, night sweats, nausea or vomiting, diarrhea, melena or hematochezia. Has been taking Keflex for kidney infection and tamsulosin. No pain medications taken.  Past Medical History:  Diagnosis Date  . Diabetes mellitus without complication (HCC)   . Gout   . Hyperlipidemia   . Hypertension     Patient Active Problem List   Diagnosis Date Noted  . UTI (urinary tract infection) 02/23/2017  . Acute pyelonephritis 02/23/2017  . Flank pain 02/23/2017    History reviewed. No pertinent surgical history.     Home Medications    Prior to Admission medications   Medication Sig Start Date End Date Taking? Authorizing Provider  allopurinol (ZYLOPRIM) 300 MG tablet Take 300 mg by mouth daily.    Historical Provider, MD    amLODipine (NORVASC) 5 MG tablet Take 5 mg by mouth daily. 12/01/16   Historical Provider, MD  atorvastatin (LIPITOR) 10 MG tablet Take 10 mg by mouth daily. 02/05/17 02/05/18  Historical Provider, MD  cephALEXin (KEFLEX) 500 MG capsule Take 1 capsule (500 mg total) by mouth 2 (two) times daily. 02/24/17   Jackie Plum, MD  citalopram (CELEXA) 20 MG tablet Take 20 mg by mouth daily.    Historical Provider, MD  clopidogrel (PLAVIX) 75 MG tablet Take 75 mg by mouth daily.    Historical Provider, MD  enalapril (VASOTEC) 20 MG tablet Take 20 mg by mouth daily.    Historical Provider, MD  gemfibrozil (LOPID) 600 MG tablet Take 600 mg by mouth 2 (two) times daily before a meal.    Historical Provider, MD  glimepiride (AMARYL) 4 MG tablet Take 4 mg by mouth daily with breakfast.    Historical Provider, MD  hydrochlorothiazide (HYDRODIURIL) 50 MG tablet Take 50 mg by mouth daily.    Historical Provider, MD  ibuprofen (ADVIL,MOTRIN) 600 MG tablet Take 1 tablet (600 mg total) by mouth every 6 (six) hours as needed for mild pain or moderate pain. 03/01/17   Lavera Guise, MD  indomethacin (INDOCIN) 50 MG capsule Take 50 mg by mouth 2 (two) times daily with a meal.    Historical Provider, MD  insulin glargine (LANTUS) 100 UNIT/ML injection Inject 30 Units into the skin at bedtime.     Historical Provider, MD  isosorbide mononitrate (IMDUR) 30 MG 24 hr  tablet Take 30 mg by mouth daily.    Historical Provider, MD  metFORMIN (GLUCOPHAGE) 1000 MG tablet Take 1,000 mg by mouth 2 (two) times daily with a meal.    Historical Provider, MD  metoprolol (LOPRESSOR) 50 MG tablet Take 50 mg by mouth 2 (two) times daily.    Historical Provider, MD  tamsulosin (FLOMAX) 0.4 MG CAPS capsule Take 1 capsule (0.4 mg total) by mouth daily. 02/25/17   Jackie Plum, MD  traZODone (DESYREL) 100 MG tablet Take 100 mg by mouth at bedtime.    Historical Provider, MD    Family History No family history on file.  Social  History Social History  Substance Use Topics  . Smoking status: Never Smoker  . Smokeless tobacco: Never Used  . Alcohol use No     Allergies   Patient has no known allergies.   Review of Systems Review of Systems  Constitutional: Negative for fever.  HENT: Negative for congestion.   Respiratory: Negative for shortness of breath.   Cardiovascular: Negative for chest pain.  Gastrointestinal: Positive for abdominal pain.  Genitourinary: Positive for dysuria, flank pain and hematuria.  Musculoskeletal: Positive for back pain.  Neurological: Negative for syncope.  Hematological: Does not bruise/bleed easily.  Psychiatric/Behavioral: Negative for confusion.  All other systems reviewed and are negative.    Physical Exam Updated Vital Signs BP 130/64   Pulse 70   Temp 98 F (36.7 C) (Oral)   Resp 20   Ht  (1.702 m)   Wt 270 lb (122.5 kg)   SpO2 95%   BMI 42.29 kg/m   Physical Exam Physical Exam  Nursing note and vitals reviewed. Constitutional: Well developed, well nourished, non-toxic, and in no acute distress Head: Normocephalic and atraumatic.  Mouth/Throat: Oropharynx is clear and moist.  Neck: Normal range of motion. Neck supple.  Cardiovascular: Normal rate and regular rhythm.   Pulmonary/Chest: Effort normal and breath sounds normal.  Abdominal: Soft. There is suprapubic and right sided tenderness. There is no rebound and no guarding. right CVA tenderness but also tenderness with palpation of the right lower paraspinal muscles  Musculoskeletal: Normal range of motion.  Neurological: Alert, no facial droop, fluent speech, moves all extremities symmetrically Skin: Skin is warm and dry.  Psychiatric: Cooperative   ED Treatments / Results  Labs (all labs ordered are listed, but only abnormal results are displayed) Labs Reviewed  CBC WITH DIFFERENTIAL/PLATELET - Abnormal; Notable for the following:       Result Value   Hemoglobin 12.8 (*)    Platelets  147 (*)    All other components within normal limits  BASIC METABOLIC PANEL - Abnormal; Notable for the following:    Glucose, Bld 135 (*)    BUN 32 (*)    All other components within normal limits  URINALYSIS, ROUTINE W REFLEX MICROSCOPIC - Abnormal; Notable for the following:    Hgb urine dipstick MODERATE (*)    Leukocytes, UA SMALL (*)    All other components within normal limits  URINALYSIS, MICROSCOPIC (REFLEX) - Abnormal; Notable for the following:    Bacteria, UA RARE (*)    Squamous Epithelial / LPF 0-5 (*)    All other components within normal limits  URINE CULTURE    EKG  EKG Interpretation None       Radiology Ct Renal Stone Study  Result Date: 03/01/2017 CLINICAL DATA:  Right flank pain. EXAM: CT ABDOMEN AND PELVIS WITHOUT CONTRAST TECHNIQUE: Multidetector CT imaging of the abdomen  and pelvis was performed following the standard protocol without IV contrast. COMPARISON:  CT scan of February 22, 2017. FINDINGS: Lower chest: No acute abnormality. Hepatobiliary: No focal liver abnormality is seen. No gallstones, gallbladder wall thickening, or biliary dilatation. Pancreas: Unremarkable. No pancreatic ductal dilatation or surrounding inflammatory changes. Spleen: Normal in size without focal abnormality. Adrenals/Urinary Tract: Adrenal glands appear normal. Stable exophytic left renal cyst is noted. Otherwise left kidney and ureter appear normal. Urinary bladder is decompressed. Right renal atrophy is again noted. Ureteral stent is no longer present. Tortuous right ureter is again noted. Mild prominence of right renal pelvis is noted which is decreased compared to prior exam. Dilatation of right upper pole collecting system is again noted and unchanged. Stomach/Bowel: Stomach is within normal limits. Appendix appears normal. No evidence of bowel wall thickening, distention, or inflammatory changes. Vascular/Lymphatic: No significant vascular findings are present. No enlarged  abdominal or pelvic lymph nodes. Reproductive: Prostate is unremarkable. Other: Stable large fat containing left inguinal hernia is noted which extends into the scrotum. No significant adenopathy is noted. Status post hernia repair in right inguinal region. Musculoskeletal: No acute or significant osseous findings. IMPRESSION: Stable right renal atrophy. Right ureteral stent noted on prior exam is no longer present. Tortuous right ureter is again noted. Mildly prominent right renal pelvis is noted which is decreased in size compared to prior exam. Stable dilatation of right upper lobe collecting system is noted. Stable large fat containing left inguinal hernia is noted which extends into scrotum. Electronically Signed   By: Lupita Raider, M.D.   On: 03/01/2017 12:25    Procedures Procedures (including critical care time)  Medications Ordered in ED Medications  ketorolac (TORADOL) 30 MG/ML injection 30 mg (30 mg Intravenous Given 03/01/17 1208)  morphine 4 MG/ML injection 4 mg (4 mg Intravenous Given 03/01/17 1208)     Initial Impression / Assessment and Plan / ED Course  I have reviewed the triage vital signs and the nursing notes.  Pertinent labs & imaging results that were available during my care of the patient were reviewed by me and considered in my medical decision making (see chart for details).     46 year old with right flank pain in the setting of UTI and recent ureteral stent removal. He is nontoxic in no acute distress with normal vital signs. Right CVA tenderness is noted, but also muscular tenderness to palpation over the right paraspinal muscles. His UA is clearing, not suggestive of worsening infection. There is moderate amount of blood, but no gross hematuria. His abdomen is soft and benign, but repeat CT was performed to look for complications such as perinephric abscess or other intra-abdominal processes. This is visualized and does not show any acute intra-abdominal  retroperitoneal processes. He is no significant leukocytosis or systemic signs or symptoms of illness that would suggest a worsening pyelonephritis. Pain improved with analgesics here in the ED. He will continue to finish his antibiotics. Anti-inflammatory medications are prescribed for pain control. Closely with his PCP f/u. Strict return and follow-up instructions reviewed. He and family expressed understanding of all discharge instructions and felt comfortable with the plan of care.    Final Clinical Impressions(s) / ED Diagnoses   Final diagnoses:  Right flank pain    New Prescriptions New Prescriptions   IBUPROFEN (ADVIL,MOTRIN) 600 MG TABLET    Take 1 tablet (600 mg total) by mouth every 6 (six) hours as needed for mild pain or moderate pain.     Annabelle Harman  Gearldine Bienenstock, MD 03/01/17 1318

## 2017-03-02 LAB — URINE CULTURE: Culture: NO GROWTH

## 2017-03-05 ENCOUNTER — Emergency Department (HOSPITAL_BASED_OUTPATIENT_CLINIC_OR_DEPARTMENT_OTHER): Payer: Medicare Other

## 2017-03-05 ENCOUNTER — Emergency Department (HOSPITAL_BASED_OUTPATIENT_CLINIC_OR_DEPARTMENT_OTHER)
Admission: EM | Admit: 2017-03-05 | Discharge: 2017-03-06 | Disposition: A | Discharge status: Home / Self Care | Payer: Medicare Other | Attending: Emergency Medicine | Admitting: Emergency Medicine

## 2017-03-05 ENCOUNTER — Encounter (HOSPITAL_BASED_OUTPATIENT_CLINIC_OR_DEPARTMENT_OTHER): Payer: Self-pay | Admitting: Emergency Medicine

## 2017-03-05 DIAGNOSIS — E119 Type 2 diabetes mellitus without complications: Secondary | ICD-10-CM | POA: Diagnosis not present

## 2017-03-05 DIAGNOSIS — M25512 Pain in left shoulder: Secondary | ICD-10-CM | POA: Diagnosis not present

## 2017-03-05 DIAGNOSIS — R079 Chest pain, unspecified: Secondary | ICD-10-CM | POA: Diagnosis not present

## 2017-03-05 DIAGNOSIS — I10 Essential (primary) hypertension: Secondary | ICD-10-CM | POA: Insufficient documentation

## 2017-03-05 DIAGNOSIS — M542 Cervicalgia: Secondary | ICD-10-CM | POA: Diagnosis not present

## 2017-03-05 DIAGNOSIS — M25552 Pain in left hip: Secondary | ICD-10-CM | POA: Diagnosis not present

## 2017-03-05 DIAGNOSIS — Z79899 Other long term (current) drug therapy: Secondary | ICD-10-CM | POA: Insufficient documentation

## 2017-03-05 DIAGNOSIS — R51 Headache: Secondary | ICD-10-CM | POA: Insufficient documentation

## 2017-03-05 DIAGNOSIS — R200 Anesthesia of skin: Secondary | ICD-10-CM | POA: Diagnosis present

## 2017-03-05 DIAGNOSIS — Z7984 Long term (current) use of oral hypoglycemic drugs: Secondary | ICD-10-CM | POA: Diagnosis not present

## 2017-03-05 LAB — CBG MONITORING, ED: GLUCOSE-CAPILLARY: 144 mg/dL — AB (ref 65–99)

## 2017-03-05 LAB — CBC WITH DIFFERENTIAL/PLATELET
BASOS ABS: 0 10*3/uL (ref 0.0–0.1)
Basophils Relative: 0 %
Eosinophils Absolute: 0.1 10*3/uL (ref 0.0–0.7)
Eosinophils Relative: 1 %
HEMATOCRIT: 39.2 % (ref 39.0–52.0)
HEMOGLOBIN: 12.4 g/dL — AB (ref 13.0–17.0)
Lymphocytes Relative: 25 %
Lymphs Abs: 2.2 10*3/uL (ref 0.7–4.0)
MCH: 28.6 pg (ref 26.0–34.0)
MCHC: 31.6 g/dL (ref 30.0–36.0)
MCV: 90.3 fL (ref 78.0–100.0)
MONO ABS: 0.5 10*3/uL (ref 0.1–1.0)
Monocytes Relative: 5 %
NEUTROS ABS: 6.1 10*3/uL (ref 1.7–7.7)
Neutrophils Relative %: 69 %
Platelets: 151 10*3/uL (ref 150–400)
RBC: 4.34 MIL/uL (ref 4.22–5.81)
RDW: 13.1 % (ref 11.5–15.5)
WBC: 8.9 10*3/uL (ref 4.0–10.5)

## 2017-03-05 LAB — BASIC METABOLIC PANEL
ANION GAP: 8 (ref 5–15)
BUN: 42 mg/dL — ABNORMAL HIGH (ref 6–20)
CALCIUM: 8.9 mg/dL (ref 8.9–10.3)
CO2: 27 mmol/L (ref 22–32)
Chloride: 104 mmol/L (ref 101–111)
Creatinine, Ser: 1.09 mg/dL (ref 0.61–1.24)
GFR calc Af Amer: >60 mL/min (ref >60)
Glucose, Bld: 163 mg/dL — ABNORMAL HIGH (ref 65–99)
Potassium: 3.9 mmol/L (ref 3.5–5.1)
SODIUM: 139 mmol/L (ref 135–145)

## 2017-03-05 LAB — TROPONIN I: Troponin I: <0.03 ng/mL (ref <0.03)

## 2017-03-05 MED ORDER — OXYCODONE-ACETAMINOPHEN 5-325 MG PO TABS
1.0000 | ORAL_TABLET | Freq: Once | ORAL | Status: AC
  Administered 2017-03-05: 1 via ORAL
  Filled 2017-03-05: qty 1

## 2017-03-05 MED ORDER — IOPAMIDOL (ISOVUE-370) INJECTION 76%
125.0000 mL | Freq: Once | INTRAVENOUS | Status: AC | PRN
  Administered 2017-03-05: 125 mL via INTRAVENOUS

## 2017-03-05 MED ORDER — SODIUM CHLORIDE 0.9 % IV BOLUS (SEPSIS)
1000.0000 mL | Freq: Once | INTRAVENOUS | Status: AC
  Administered 2017-03-05: 1000 mL via INTRAVENOUS

## 2017-03-05 NOTE — ED Provider Notes (Signed)
[facility] Provider Note   CSN: [id number] Arrival date & time: 03/05/17  6962   By signing my name below, I, Clarisse Gouge, attest that this documentation has been prepared under the direction and in the presence of [facility], PA-C. Electronically signed, Clarisse Gouge, ED Scribe. 03/05/17. 8:23 PM.   History   Chief Complaint Chief Complaint  Patient presents with  . Numbness   The history is provided by the patient and medical records. The history is limited by a language barrier. A language interpreter was used (Brother-in-law at bedside translating. Offered official medical interpreter which patient declined).    Cory Copeland is a 46 y.o. male with DM, HLD, HTN, peripheral neuropathy and gout who presents to the Emergency Department with concern for sudden onset, episodic shoulder pain starting ~9:30 AM today. Associated with left posterior head, ear and neck pain that has mildly improved without intervention. Also endorses left sided mid-low back pain, left leg weakness. Patient states these symptoms occurred at 9:30 today - lasted around 30 minutes to an hour - then resolved. A few hours later, the same constellation of symptoms returned. Again lasted 30 minutes to an hour then resolved. When pain returned again, he became concerned and came to ER. No h/o similar symptoms noted. Pt describes 6/10, aching intemittent L shoulder pain worsened with contact and movement. L posterior neck pain worsened with palpation and movement. Pt has not used any medications besides his regular medications today. Pt taking Celexa for anxiety. No gait change, leg swelling, injury, trauma, activity change or any other complaints noted at this time. Treated for UTI last week and notes urinary symptoms now resolved.   Past Medical History:  Diagnosis Date  . Diabetes mellitus without complication (HCC)   . Gout   . Hyperlipidemia   . Hypertension     Patient Active Problem List   Diagnosis Date Noted  . UTI (urinary tract infection) 02/23/2017  . Acute pyelonephritis 02/23/2017  . Flank pain 02/23/2017    History reviewed. No pertinent surgical history.     Home Medications    Prior to Admission medications   Medication Sig Start Date End Date Taking? Authorizing Provider  allopurinol (ZYLOPRIM) 300 MG tablet Take 300 mg by mouth daily.    Historical Provider, MD  amLODipine (NORVASC) 5 MG tablet Take 5 mg by mouth daily. 12/01/16   Historical Provider, MD  atorvastatin (LIPITOR) 10 MG tablet Take 10 mg by mouth daily. 02/05/17 02/05/18  Historical Provider, MD  cephALEXin (KEFLEX) 500 MG capsule Take 1 capsule (500 mg total) by mouth 2 (two) times daily. 02/24/17   Jackie Plum, MD  citalopram (CELEXA) 20 MG tablet Take 20 mg by mouth daily.    Historical Provider, MD  clopidogrel (PLAVIX) 75 MG tablet Take 75 mg by mouth daily.    Historical Provider, MD  enalapril (VASOTEC) 20 MG tablet Take 20 mg by mouth daily.    Historical Provider, MD  gemfibrozil (LOPID) 600 MG tablet Take 600 mg by mouth 2 (two) times daily before a meal.    Historical Provider, MD  glimepiride (AMARYL) 4 MG tablet Take 4 mg by mouth daily with breakfast.    Historical Provider, MD  hydrochlorothiazide (HYDRODIURIL) 50 MG tablet Take 50 mg by mouth daily.    Historical Provider, MD  ibuprofen (ADVIL,MOTRIN) 600 MG tablet Take 1 tablet (600 mg total) by mouth every 6 (six) hours as needed for mild pain or moderate pain. 03/01/17  Lavera Guise, MD  indomethacin (INDOCIN) 50 MG capsule Take 50 mg by mouth 2 (two) times daily with a meal.    Historical Provider, MD  insulin glargine (LANTUS) 100 UNIT/ML injection Inject 30 Units into the skin at bedtime.     Historical Provider, MD  isosorbide mononitrate (IMDUR) 30 MG 24 hr tablet Take 30 mg by mouth daily.    Historical Provider, MD  metFORMIN (GLUCOPHAGE) 1000 MG tablet Take 1,000 mg by mouth 2 (two) times daily with a meal.     Historical Provider, MD  methocarbamol (ROBAXIN) 500 MG tablet Take 1 tablet (500 mg total) by mouth 2 (two) times daily. 03/06/17   Chase Picket Ward, PA-C  metoprolol (LOPRESSOR) 50 MG tablet Take 50 mg by mouth 2 (two) times daily.    Historical Provider, MD  naproxen (NAPROSYN) 500 MG tablet Take 1 tablet (500 mg total) by mouth 2 (two) times daily. 03/06/17   Chase Picket Ward, PA-C  tamsulosin (FLOMAX) 0.4 MG CAPS capsule Take 1 capsule (0.4 mg total) by mouth daily. 02/25/17   Jackie Plum, MD  traZODone (DESYREL) 100 MG tablet Take 100 mg by mouth at bedtime.    Historical Provider, MD    Family History History reviewed. No pertinent family history.  Social History Social History  Substance Use Topics  . Smoking status: Never Smoker  . Smokeless tobacco: Never Used  . Alcohol use No     Allergies   Patient has no known allergies.   Review of Systems Review of Systems  Constitutional: Negative for activity change.  Eyes: Negative for visual disturbance.  Cardiovascular: Negative for leg swelling.  Musculoskeletal: Positive for arthralgias, back pain and neck pain. Negative for gait problem.  Skin: Negative for wound.  Neurological: Positive for weakness, numbness and headaches.  All other systems reviewed and are negative.    Physical Exam Updated Vital Signs BP (!) 149/82 (BP Location: Right Arm)   Pulse 88   Temp 98.2 F (36.8 C) (Oral)   Resp 16   Ht 5\' 7"  (1.702 m)   Wt 270 lb (122.5 kg)   SpO2 96%   BMI 42.29 kg/m   Physical Exam  Constitutional: He is oriented to person, place, and time. He appears well-developed and well-nourished. No distress.  HENT:  Head: Normocephalic and atraumatic.  Cardiovascular: Normal rate, regular rhythm and normal heart sounds.   No murmur heard. Pulmonary/Chest: Effort normal and breath sounds normal. No respiratory distress.  Abdominal: Soft. He exhibits no distension. There is no tenderness.  Musculoskeletal:  He exhibits tenderness. He exhibits no edema.  TTP of midline and left-sided C spine. No T or L spine tenderness. TTP along posterior left shoulder musculature. Full ROM of the shoulder. Negative Neer's.   Neurological: He is alert and oriented to person, place, and time.  Alert, oriented, thought content appropriate, able to give a coherent history. Speech is clear and goal oriented, able to follow commands.  Cranial Nerves:  II:  Peripheral visual fields grossly normal, pupils equal, round, reactive to light III, IV, VI: EOM intact bilaterally, ptosis not present V,VII: smile symmetric, eyes kept closed tightly against resistance, facial light touch sensation equal VIII: hearing grossly normal IX, X: symmetric soft palate movement, uvula elevates symmetrically  XI: bilateral shoulder shrug symmetric and strong XII: midline tongue extension 5/5 muscle strength in upper extremities and right lower extremity. 4/5 muscle strength in left lower extremity.  Sensory to light touch normal in all four extremities.  Normal finger-to-nose and rapid alternating movements; Negative romberg, no pronator drift.  Skin: Skin is warm and dry.  Nursing note and vitals reviewed.    ED Treatments / Results  DIAGNOSTIC STUDIES: Oxygen Saturation is 96% on RA, NL by my interpretation.    COORDINATION OF CARE: 8:18 PM-Discussed next steps with pt. Pt verbalized understanding and is agreeable with the plan. Will order imaging.   Labs (all labs ordered are listed, but only abnormal results are displayed) Labs Reviewed  BASIC METABOLIC PANEL - Abnormal; Notable for the following:       Result Value   Glucose, Bld 163 (*)    BUN 42 (*)    All other components within normal limits  CBC WITH DIFFERENTIAL/PLATELET - Abnormal; Notable for the following:    Hemoglobin 12.4 (*)    All other components within normal limits  URINALYSIS, ROUTINE W REFLEX MICROSCOPIC - Abnormal; Notable for the following:    Hgb  urine dipstick TRACE (*)    All other components within normal limits  CBG MONITORING, ED - Abnormal; Notable for the following:    Glucose-Capillary 144 (*)    All other components within normal limits  TROPONIN I  URINALYSIS, MICROSCOPIC (REFLEX)    EKG  EKG Interpretation  Date/Time:  Tuesday March 05 2017 19:37:38 EDT Ventricular Rate:  91 PR Interval:    QRS Duration: 88 QT Interval:  356 QTC Calculation: 438 R Axis:   49 Text Interpretation:  Sinus rhythm Confirmed by Lincoln Brigham [phone]) on 03/05/2017 7:42:21 PM       Radiology Dg Chest 2 View  Result Date: 03/05/2017 CLINICAL DATA:  Chest and shoulder pain this morning. Hypoxia. History of hypertension, hyperlipidemia and diabetes. EXAM: CHEST  2 VIEW COMPARISON:  None. FINDINGS: The cardiac silhouette is upper limits of normal in size, mediastinal silhouette is nonsuspicious. Prominent pleural fat. Atelectasis best appreciated on the lateral radiograph. No pleural effusion or focal consolidation. Obtained tapered appearance of RIGHT mainstem bronchus. No pneumothorax. Large body habitus. Moderate degenerative change of the thoracic spine. IMPRESSION: Tapered appearance of RIGHT mainstem bronchus, possibly artifact. Borderline cardiomegaly Electronically Signed   By: Awilda Metro M.D.   On: 03/05/2017 20:49   Ct Head Wo Contrast  Result Date: 03/05/2017 CLINICAL DATA:  46 y/o M; 46 y/o M; pain in the back of head and neck since this morning. EXAM: CT HEAD WITHOUT CONTRAST CT CERVICAL SPINE WITHOUT CONTRAST TECHNIQUE: Multidetector CT imaging of the head and cervical spine was performed following the standard protocol without intravenous contrast. Multiplanar CT image reconstructions of the cervical spine were also generated. COMPARISON:  None. FINDINGS: CT HEAD FINDINGS Brain: No evidence of acute infarction, hemorrhage, hydrocephalus, extra-axial collection or mass lesion/mass effect. Vascular: No hyperdense vessel or  unexpected calcification. Skull: Normal. Negative for fracture or focal lesion. Sinuses/Orbits: No acute finding. Other: None. CT CERVICAL SPINE FINDINGS Alignment: Straightening of cervical lordosis.  No listhesis. Skull base and vertebrae: No acute fracture. No primary bone lesion or focal pathologic process. Soft tissues and spinal canal: No prevertebral fluid or swelling. No visible canal hematoma. Disc levels: Minimal lower cervical discogenic degenerative changes. No high-grade bony canal stenosis or foraminal narrowing. Upper chest: Negative. Other: Negative IMPRESSION: 1. Normal CT of the head.  No acute intracranial abnormality. 2. Minimal cervical spine spondylosis.  No acute process identified. Electronically Signed   By: Mitzi Hansen M.D.   On: 03/05/2017 21:09   Ct Cervical Spine Wo Contrast  Result Date: 03/05/2017  CLINICAL DATA:  46 y/o M; 46 y/o M; pain in the back of head and neck since this morning. EXAM: CT HEAD WITHOUT CONTRAST CT CERVICAL SPINE WITHOUT CONTRAST TECHNIQUE: Multidetector CT imaging of the head and cervical spine was performed following the standard protocol without intravenous contrast. Multiplanar CT image reconstructions of the cervical spine were also generated. COMPARISON:  None. FINDINGS: CT HEAD FINDINGS Brain: No evidence of acute infarction, hemorrhage, hydrocephalus, extra-axial collection or mass lesion/mass effect. Vascular: No hyperdense vessel or unexpected calcification. Skull: Normal. Negative for fracture or focal lesion. Sinuses/Orbits: No acute finding. Other: None. CT CERVICAL SPINE FINDINGS Alignment: Straightening of cervical lordosis.  No listhesis. Skull base and vertebrae: No acute fracture. No primary bone lesion or focal pathologic process. Soft tissues and spinal canal: No prevertebral fluid or swelling. No visible canal hematoma. Disc levels: Minimal lower cervical discogenic degenerative changes. No high-grade bony canal stenosis or  foraminal narrowing. Upper chest: Negative. Other: Negative IMPRESSION: 1. Normal CT of the head.  No acute intracranial abnormality. 2. Minimal cervical spine spondylosis.  No acute process identified. Electronically Signed   By: Mitzi Hansen M.D.   On: 03/05/2017 21:09   Dg Shoulder Left  Result Date: 03/05/2017 CLINICAL DATA:  LEFT shoulder pain beginning this morning. EXAM: LEFT SHOULDER - 2+ VIEW COMPARISON:  None. FINDINGS: The humeral head is well-formed and located. The subacromial, glenohumeral and acromioclavicular joint spaces are intact. No destructive bony lesions. Soft tissue planes are non-suspicious. IMPRESSION: Negative. Electronically Signed   By: Awilda Metro M.D.   On: 03/05/2017 20:50   Ct Angio Chest Aorta W And/or Wo Contrast  Result Date: 03/05/2017 CLINICAL DATA:  Acute onset of left ear, neck, head and arm pain, and left arm numbness. Lower back pain and dizziness. Initial encounter. EXAM: CT ANGIOGRAPHY CHEST, ABDOMEN AND PELVIS TECHNIQUE: Multidetector CT imaging through the chest, abdomen and pelvis was performed using the standard protocol during bolus administration of intravenous contrast. Multiplanar reconstructed images and MIPs were obtained and reviewed to evaluate the vascular anatomy. CONTRAST:  125 mL of Isovue 370 IV contrast COMPARISON:  Chest radiograph performed earlier today at 7:47 p.m. FINDINGS: CTA CHEST FINDINGS Cardiovascular: There is no evidence of aortic dissection. There is no evidence of aneurysmal dilatation. No calcific atherosclerotic disease is seen. The heart is normal in size. The thoracic aorta is grossly unremarkable. The great vessels are within normal limits. Mediastinum/Nodes: No mediastinal lymphadenopathy is seen. No pericardial effusion is identified. The visualized portions of the thyroid gland are unremarkable. No axillary lymphadenopathy is appreciated. Lungs/Pleura: Minimal bilateral atelectasis or scarring is noted. The  lungs are otherwise clear. No pleural effusion or pneumothorax is seen. A 4-5 mm nodule is noted at the right lung apex (image 13 of 61). Musculoskeletal: No acute osseous abnormalities are identified. Anterior bridging osteophytes are noted along the thoracic spine. The visualized musculature is unremarkable in appearance. Review of the MIP images confirms the above findings. CTA ABDOMEN AND PELVIS FINDINGS VASCULAR Aorta: There is no evidence of aortic dissection. There is no evidence of aneurysmal dilatation. No calcific atherosclerotic disease is seen. Celiac:  The celiac trunk appears fully patent. SMA: The superior mesenteric artery is unremarkable in appearance. Renals: The renal arteries appear intact bilaterally. IMA: The inferior mesenteric artery is grossly unremarkable in appearance. Inflow: The common, external internal iliac arteries appear fully patent bilaterally. The common femoral arteries and their proximal branches are grossly unremarkable in appearance. Veins: The visualized venous structures are grossly unremarkable  in appearance. Review of the MIP images confirms the above findings. NON-VASCULAR Hepatobiliary: The liver is unremarkable in appearance. The gallbladder is unremarkable in appearance. The common bile duct remains normal in caliber. Pancreas: The pancreas is within normal limits. Spleen: The spleen is unremarkable in appearance. Adrenals/Urinary Tract: The adrenal glands are unremarkable in appearance. There is mild right-sided hydronephrosis, with increased prominence of the right ureter in comparison to the prior study, and surrounding soft tissue inflammation again noted. Underlying right renal atrophy is again seen. The distal right ureter extends into a tiny right inguinal hernia, before returning inferiorly to the bladder. The left kidney is grossly unremarkable in appearance. Mild nonspecific perinephric stranding is noted bilaterally. No obstructing ureteral stones are  identified. Stomach/Bowel: The stomach is unremarkable in appearance. The small bowel is within normal limits. The appendix is normal in caliber, without evidence of appendicitis. The colon is unremarkable in appearance. Lymphatic: No retroperitoneal or pelvic sidewall lymphadenopathy is seen. Reproductive: The bladder is mildly distended and grossly unremarkable in appearance. The prostate remains normal in size. Other: A large left inguinal hernia is seen, containing only fat. Musculoskeletal: No acute osseous abnormalities are identified. The visualized musculature is unremarkable in appearance. Review of the MIP images confirms the above findings. IMPRESSION: 1. No evidence of aortic dissection. No evidence of aneurysmal dilatation. No calcific atherosclerotic disease seen. 2. Mild right-sided hydronephrosis, with increased prominence of the right ureter in comparison to the prior study, and surrounding soft tissue inflammation again noted. Underlying right renal atrophy again seen. Distal ureter extends into a tiny right inguinal hernia, before returning inferiorly to the bladder. 3. Large left inguinal hernia, containing only fat. 4. 4-5 mm nodule at the right lung apex. No follow-up needed if patient is low-risk. Non-contrast chest CT can be considered in 12 months if patient is high-risk. This recommendation follows the consensus statement: Guidelines for Management of Incidental Pulmonary Nodules Detected on CT Images: From the Fleischner Society 2017; Radiology 2017; 284:228-243. Electronically Signed   By: Roanna Raider M.D.   On: 03/05/2017 23:04   Ct Angio Abd/pel W And/or Wo Contrast  Result Date: 03/05/2017 CLINICAL DATA:  Acute onset of left ear, neck, head and arm pain, and left arm numbness. Lower back pain and dizziness. Initial encounter. EXAM: CT ANGIOGRAPHY CHEST, ABDOMEN AND PELVIS TECHNIQUE: Multidetector CT imaging through the chest, abdomen and pelvis was performed using the standard  protocol during bolus administration of intravenous contrast. Multiplanar reconstructed images and MIPs were obtained and reviewed to evaluate the vascular anatomy. CONTRAST:  125 mL of Isovue 370 IV contrast COMPARISON:  Chest radiograph performed earlier today at 7:47 p.m. FINDINGS: CTA CHEST FINDINGS Cardiovascular: There is no evidence of aortic dissection. There is no evidence of aneurysmal dilatation. No calcific atherosclerotic disease is seen. The heart is normal in size. The thoracic aorta is grossly unremarkable. The great vessels are within normal limits. Mediastinum/Nodes: No mediastinal lymphadenopathy is seen. No pericardial effusion is identified. The visualized portions of the thyroid gland are unremarkable. No axillary lymphadenopathy is appreciated. Lungs/Pleura: Minimal bilateral atelectasis or scarring is noted. The lungs are otherwise clear. No pleural effusion or pneumothorax is seen. A 4-5 mm nodule is noted at the right lung apex (image 13 of 61). Musculoskeletal: No acute osseous abnormalities are identified. Anterior bridging osteophytes are noted along the thoracic spine. The visualized musculature is unremarkable in appearance. Review of the MIP images confirms the above findings. CTA ABDOMEN AND PELVIS FINDINGS VASCULAR Aorta: There is  no evidence of aortic dissection. There is no evidence of aneurysmal dilatation. No calcific atherosclerotic disease is seen. Celiac:  The celiac trunk appears fully patent. SMA: The superior mesenteric artery is unremarkable in appearance. Renals: The renal arteries appear intact bilaterally. IMA: The inferior mesenteric artery is grossly unremarkable in appearance. Inflow: The common, external internal iliac arteries appear fully patent bilaterally. The common femoral arteries and their proximal branches are grossly unremarkable in appearance. Veins: The visualized venous structures are grossly unremarkable in appearance. Review of the MIP images  confirms the above findings. NON-VASCULAR Hepatobiliary: The liver is unremarkable in appearance. The gallbladder is unremarkable in appearance. The common bile duct remains normal in caliber. Pancreas: The pancreas is within normal limits. Spleen: The spleen is unremarkable in appearance. Adrenals/Urinary Tract: The adrenal glands are unremarkable in appearance. There is mild right-sided hydronephrosis, with increased prominence of the right ureter in comparison to the prior study, and surrounding soft tissue inflammation again noted. Underlying right renal atrophy is again seen. The distal right ureter extends into a tiny right inguinal hernia, before returning inferiorly to the bladder. The left kidney is grossly unremarkable in appearance. Mild nonspecific perinephric stranding is noted bilaterally. No obstructing ureteral stones are identified. Stomach/Bowel: The stomach is unremarkable in appearance. The small bowel is within normal limits. The appendix is normal in caliber, without evidence of appendicitis. The colon is unremarkable in appearance. Lymphatic: No retroperitoneal or pelvic sidewall lymphadenopathy is seen. Reproductive: The bladder is mildly distended and grossly unremarkable in appearance. The prostate remains normal in size. Other: A large left inguinal hernia is seen, containing only fat. Musculoskeletal: No acute osseous abnormalities are identified. The visualized musculature is unremarkable in appearance. Review of the MIP images confirms the above findings. IMPRESSION: 1. No evidence of aortic dissection. No evidence of aneurysmal dilatation. No calcific atherosclerotic disease seen. 2. Mild right-sided hydronephrosis, with increased prominence of the right ureter in comparison to the prior study, and surrounding soft tissue inflammation again noted. Underlying right renal atrophy again seen. Distal ureter extends into a tiny right inguinal hernia, before returning inferiorly to the  bladder. 3. Large left inguinal hernia, containing only fat. 4. 4-5 mm nodule at the right lung apex. No follow-up needed if patient is low-risk. Non-contrast chest CT can be considered in 12 months if patient is high-risk. This recommendation follows the consensus statement: Guidelines for Management of Incidental Pulmonary Nodules Detected on CT Images: From the Fleischner Society 2017; Radiology 2017; 284:228-243. Electronically Signed   By: Roanna Raider M.D.   On: 03/05/2017 23:04    Procedures Procedures (including critical care time)  Medications Ordered in ED Medications  sodium chloride 0.9 % bolus 1,000 mL (0 mLs Intravenous Stopped 03/06/17 0028)  oxyCODONE-acetaminophen (PERCOCET/ROXICET) 5-325 MG per tablet 1 tablet (1 tablet Oral Given 03/05/17 2237)  iopamidol (ISOVUE-370) 76 % injection 125 mL (125 mLs Intravenous Contrast Given 03/05/17 2218)     Initial Impression / Assessment and Plan / ED Course  I have reviewed the triage vital signs and the nursing notes.  Pertinent labs & imaging results that were available during my care of the patient were reviewed by me and considered in my medical decision making (see chart for details).    Cory Copeland is a 46 y.o. male who presents to ED for left shoulder pain, left sided neck pain, left hip pain and left leg weakness which began today. Troponin negative. EKG reassuring. CT head and neck negative.  Appears more musk  on exam. Doubt stroke or ACS. Labs reviewed and reassuring. UA negative. CT chest/abd/pelvis with no acute findings. Pain controlled in ED. On re-evaluation, able to ambulate without difficulty. Repeat musk exam with 5/5 muscle strength in all four extremities. Evaluation does not show pathology that would require ongoing emergent intervention or inpatient treatment. Symptomatic home care instructions discussed. Reasons to return to ER discussed. All questions answered.   Final Clinical Impressions(s) / ED Diagnoses     Final diagnoses:  Neck pain  Acute pain of left shoulder  Left hip pain    New Prescriptions Discharge Medication List as of 03/06/2017 12:29 AM    START taking these medications   Details  methocarbamol (ROBAXIN) 500 MG tablet Take 1 tablet (500 mg total) by mouth 2 (two) times daily., Starting Wed 03/06/2017, Print    naproxen (NAPROSYN) 500 MG tablet Take 1 tablet (500 mg total) by mouth 2 (two) times daily., Starting Wed 03/06/2017, Print       I personally performed the services described in this documentation, which was scribed in my presence. The recorded information has been reviewed and is accurate.    [facility], PA-C 03/06/17 0981    Tilden Fossa, MD 03/10/17 [phone]

## 2017-03-05 NOTE — ED Triage Notes (Signed)
Patient states that he was at work and he started to have pain in the back of his head. He then states that he had some noted numbness and pain to his left arm. The patient reports that this does not feel like his peripheral neuropathy. Patient has good CMS and sensation to his left arm.

## 2017-03-05 NOTE — ED Notes (Signed)
Patient transported to CT 

## 2017-03-05 NOTE — ED Notes (Signed)
Patient returned from imaging.

## 2017-03-05 NOTE — ED Notes (Signed)
Patient transported to X-ray 

## 2017-03-05 NOTE — ED Notes (Signed)
Pt. In radiology at present time

## 2017-03-06 LAB — URINALYSIS, ROUTINE W REFLEX MICROSCOPIC
BILIRUBIN URINE: NEGATIVE
GLUCOSE, UA: NEGATIVE mg/dL
Ketones, ur: NEGATIVE mg/dL
Leukocytes, UA: NEGATIVE
Nitrite: NEGATIVE
PROTEIN: NEGATIVE mg/dL
Specific Gravity, Urine: 1.026 (ref 1.005–1.030)
pH: 5 (ref 5.0–8.0)

## 2017-03-06 LAB — URINALYSIS, MICROSCOPIC (REFLEX)
BACTERIA UA: NONE SEEN
SQUAMOUS EPITHELIAL / LPF: NONE SEEN

## 2017-03-06 MED ORDER — NAPROXEN 500 MG PO TABS: 500.0000 mg | ORAL_TABLET | Freq: Two times a day (BID) | ORAL | 0 refills | Status: AC

## 2017-03-06 MED ORDER — METHOCARBAMOL 500 MG PO TABS: 500.0000 mg | ORAL_TABLET | Freq: Two times a day (BID) | ORAL | 0 refills | Status: AC

## 2017-03-06 NOTE — Discharge Instructions (Signed)
It was my pleasure taking care of you today!   Naproxen as needed for pain. Robaxin if your muscle relaxer to take as needed. Ice or heat to affected area for additional pain relief. Please follow up with your primary doctor for further discussion about your hospital visit today and recheck of your symptoms. Please return to the ER for new or worsening symptoms, any additional concerns.

## 2018-04-18 IMAGING — CT CT RENAL STONE PROTOCOL
2 of 4 series · 16 of 46 positions shown, 18 images · non-contrast
Comparison: CT scan of February 22, 2017.

CLINICAL DATA: Right flank pain.

EXAM:
CT ABDOMEN AND PELVIS WITHOUT CONTRAST
TECHNIQUE: Multidetector CT imaging of the abdomen and pelvis was performed
following the standard protocol without IV contrast.

[Series 2: axial st · axial · 0.98mm/px · z∈[-631,-76]mm · 13 of 121 slices shown, 15 images]
[im 5/121  soft-tissue]
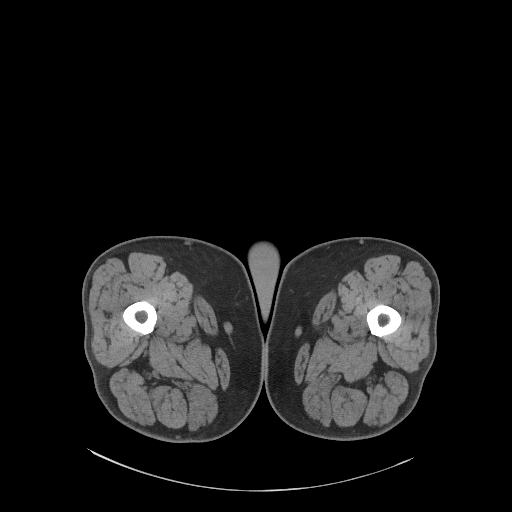
[im 5/121  bone]
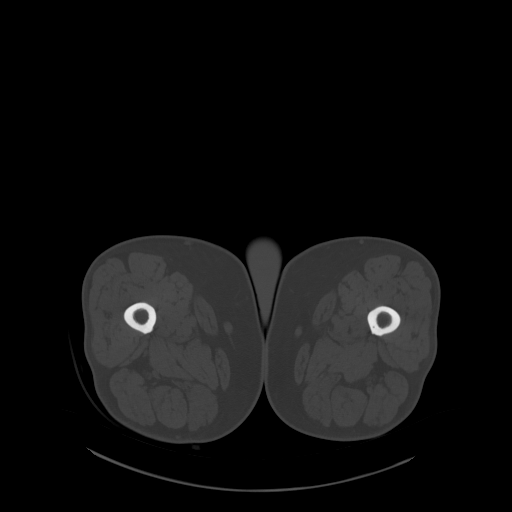
[im 14/121  soft-tissue]
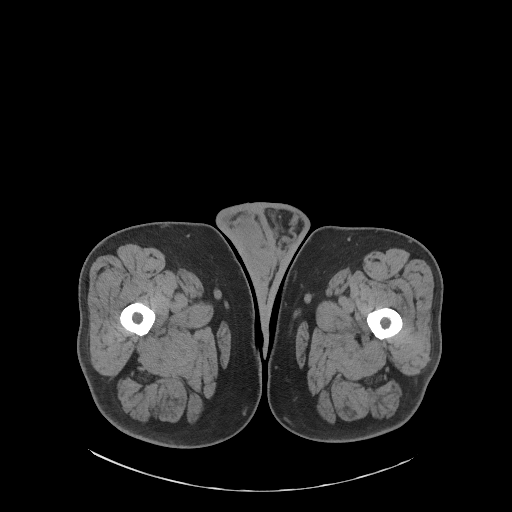
[im 24/121  soft-tissue]
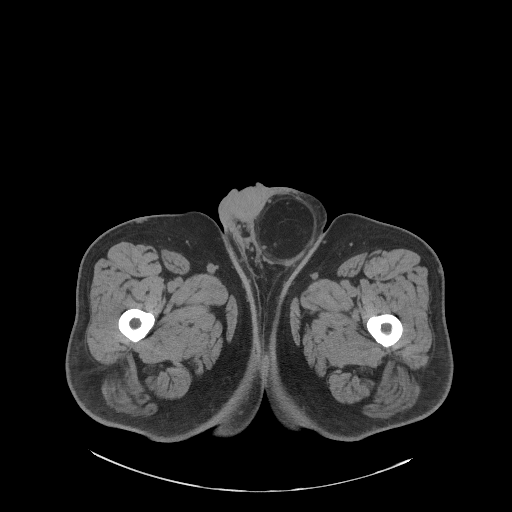
[im 33/121  soft-tissue]
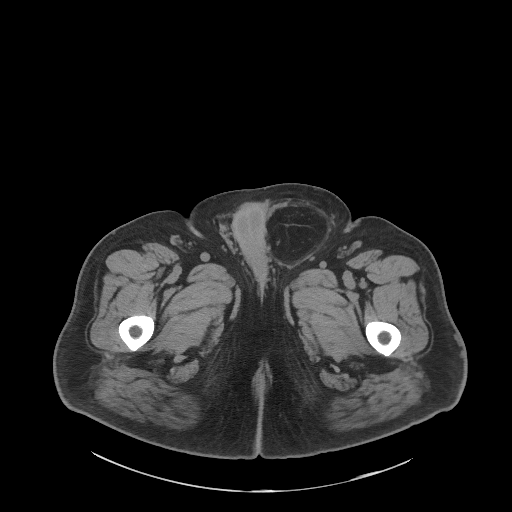
[im 42/121  soft-tissue]
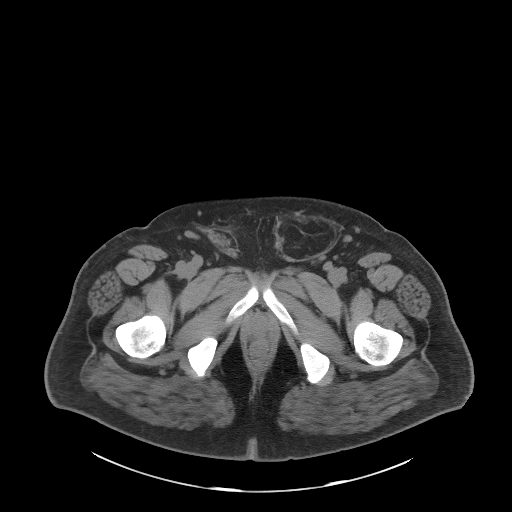
[im 51/121  soft-tissue]
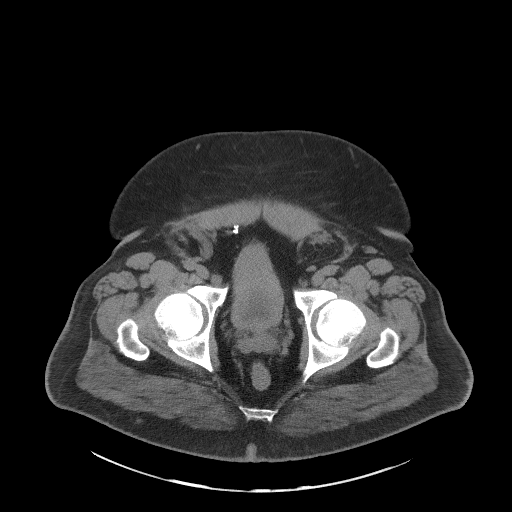
[im 60/121  soft-tissue]
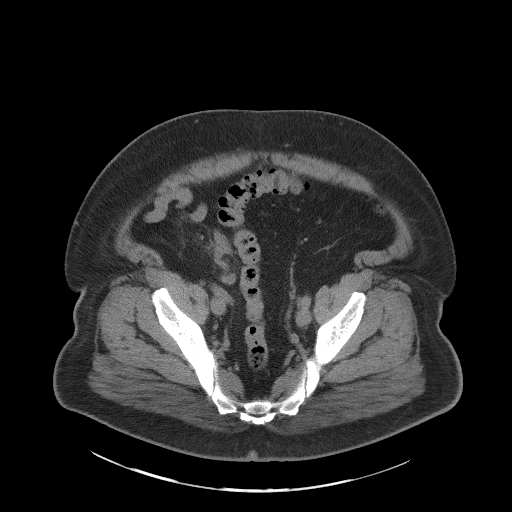
[im 70/121  soft-tissue]
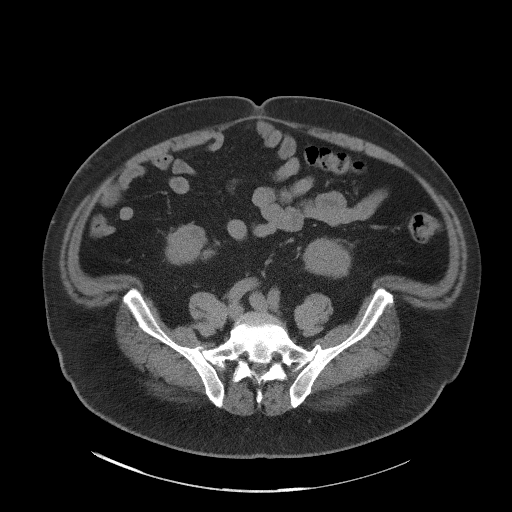
[im 79/121  soft-tissue]
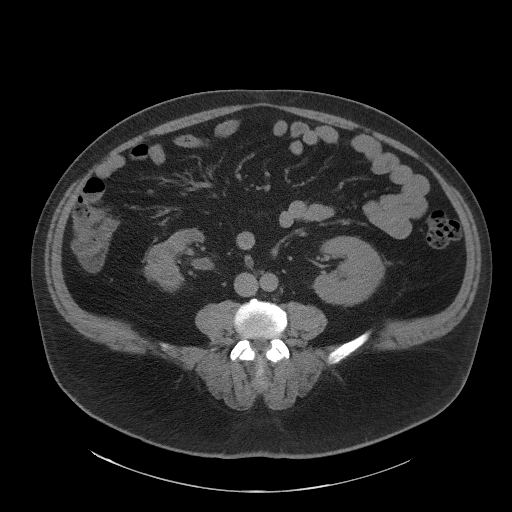
[im 79/121  bone]
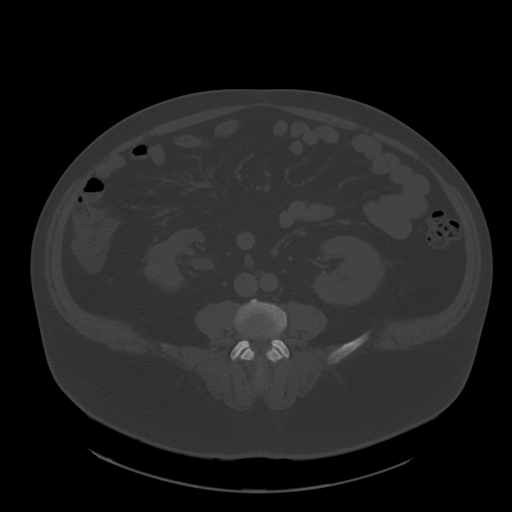
[im 88/121  soft-tissue]
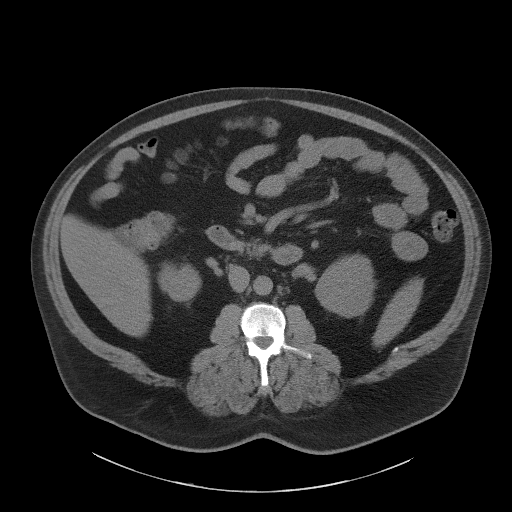
[im 97/121  soft-tissue]
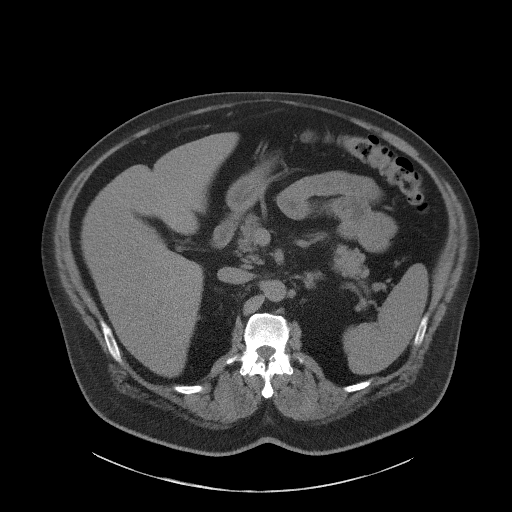
[im 107/121  soft-tissue]
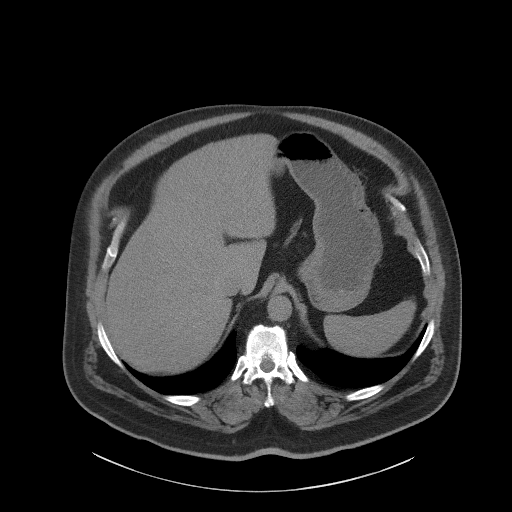
[im 116/121  soft-tissue]
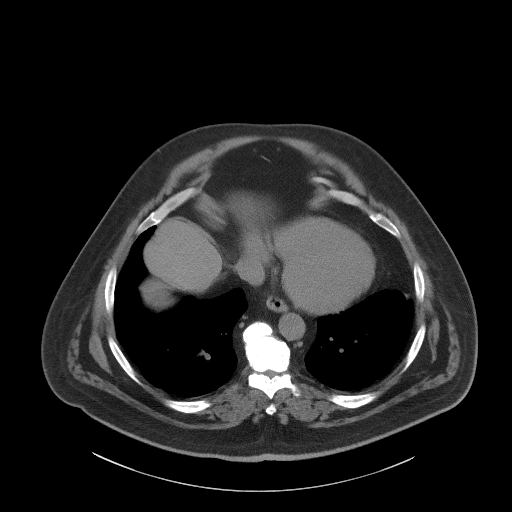

[Series 4: coronal st · coronal · 1.00mm/px · 3 of 130 slices shown]
[im 44/130  soft-tissue]
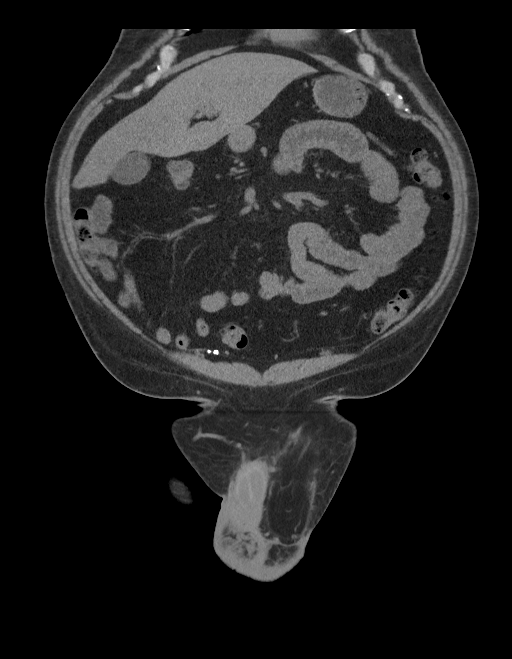
[im 58/130  soft-tissue]
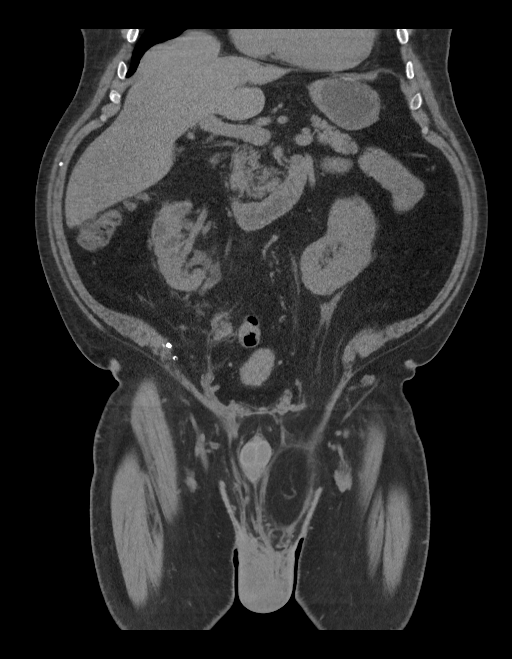
[im 72/130  soft-tissue]
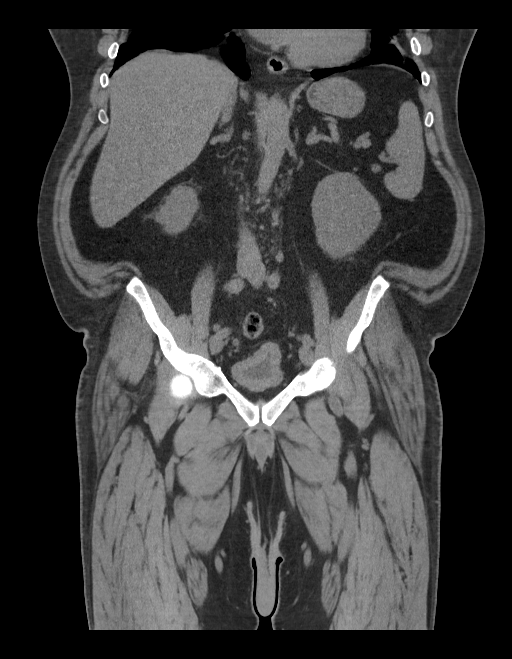

[16 of 46 positions shown; findings below may reference images not displayed]

FINDINGS: Lower chest: No acute abnormality.

Hepatobiliary: No focal liver abnormality is seen. No gallstones,
gallbladder wall thickening, or biliary dilatation.

Pancreas: Unremarkable. No pancreatic ductal dilatation or
surrounding inflammatory changes.

Spleen: Normal in size without focal abnormality.

Adrenals/Urinary Tract: Adrenal glands appear normal. Stable
exophytic left renal cyst is noted. Otherwise left kidney and ureter
appear normal. Urinary bladder is decompressed. Right renal atrophy
is again noted. Ureteral stent is no longer present. Tortuous right
ureter is again noted. Mild prominence of right renal pelvis is
noted which is decreased compared to prior exam. Dilatation of right
upper pole collecting system is again noted and unchanged.

Stomach/Bowel: Stomach is within normal limits. Appendix appears
normal. No evidence of bowel wall thickening, distention, or
inflammatory changes.

Vascular/Lymphatic: No significant vascular findings are present. No
enlarged abdominal or pelvic lymph nodes.

Reproductive: Prostate is unremarkable.

Other: Stable large fat containing left inguinal hernia is noted
which extends into the scrotum. No significant adenopathy is noted.
Status post hernia repair in right inguinal region.

Musculoskeletal: No acute or significant osseous findings.
IMPRESSION: Stable right renal atrophy. Right ureteral stent noted on prior exam
is no longer present. Tortuous right ureter is again noted. Mildly
prominent right renal pelvis is noted which is decreased in size
compared to prior exam. Stable dilatation of right upper lobe
collecting system is noted.

Stable large fat containing left inguinal hernia is noted which
extends into scrotum.

## 2018-09-22 ENCOUNTER — Emergency Department (HOSPITAL_BASED_OUTPATIENT_CLINIC_OR_DEPARTMENT_OTHER)
Admission: EM | Admit: 2018-09-22 | Discharge: 2018-09-22 | Disposition: A | Discharge status: Home / Self Care | Payer: Medicare Other | Attending: Emergency Medicine | Admitting: Emergency Medicine

## 2018-09-22 ENCOUNTER — Encounter (HOSPITAL_BASED_OUTPATIENT_CLINIC_OR_DEPARTMENT_OTHER): Payer: Self-pay | Admitting: Emergency Medicine

## 2018-09-22 ENCOUNTER — Other Ambulatory Visit: Payer: Self-pay

## 2018-09-22 ENCOUNTER — Emergency Department (HOSPITAL_BASED_OUTPATIENT_CLINIC_OR_DEPARTMENT_OTHER): Payer: Medicare Other

## 2018-09-22 DIAGNOSIS — I10 Essential (primary) hypertension: Secondary | ICD-10-CM

## 2018-09-22 DIAGNOSIS — Z794 Long term (current) use of insulin: Secondary | ICD-10-CM | POA: Diagnosis not present

## 2018-09-22 DIAGNOSIS — N390 Urinary tract infection, site not specified: Secondary | ICD-10-CM | POA: Insufficient documentation

## 2018-09-22 DIAGNOSIS — Z7902 Long term (current) use of antithrombotics/antiplatelets: Secondary | ICD-10-CM | POA: Diagnosis not present

## 2018-09-22 DIAGNOSIS — Z79899 Other long term (current) drug therapy: Secondary | ICD-10-CM | POA: Insufficient documentation

## 2018-09-22 DIAGNOSIS — E119 Type 2 diabetes mellitus without complications: Secondary | ICD-10-CM | POA: Diagnosis not present

## 2018-09-22 DIAGNOSIS — R1032 Left lower quadrant pain: Secondary | ICD-10-CM | POA: Diagnosis present

## 2018-09-22 DIAGNOSIS — K439 Ventral hernia without obstruction or gangrene: Secondary | ICD-10-CM | POA: Diagnosis not present

## 2018-09-22 LAB — BASIC METABOLIC PANEL
Anion gap: 11 (ref 5–15)
BUN: 27 mg/dL — AB (ref 6–20)
CO2: 25 mmol/L (ref 22–32)
CREATININE: 0.78 mg/dL (ref 0.61–1.24)
Calcium: 9.1 mg/dL (ref 8.9–10.3)
Chloride: 101 mmol/L (ref 98–111)
GLUCOSE: 281 mg/dL — AB (ref 70–99)
Potassium: 4.2 mmol/L (ref 3.5–5.1)
SODIUM: 137 mmol/L (ref 135–145)

## 2018-09-22 LAB — URINALYSIS, ROUTINE W REFLEX MICROSCOPIC
Bilirubin Urine: NEGATIVE
Ketones, ur: NEGATIVE mg/dL
Nitrite: NEGATIVE
PH: 6 (ref 5.0–8.0)
Protein, ur: 30 mg/dL — AB
Specific Gravity, Urine: 1.02 (ref 1.005–1.030)

## 2018-09-22 LAB — CBC WITH DIFFERENTIAL/PLATELET
Abs Immature Granulocytes: 0.03 10*3/uL (ref 0.00–0.07)
Basophils Absolute: 0 10*3/uL (ref 0.0–0.1)
Basophils Relative: 0 %
Eosinophils Absolute: 0.1 10*3/uL (ref 0.0–0.5)
Eosinophils Relative: 1 %
HEMATOCRIT: 44.4 % (ref 39.0–52.0)
HEMOGLOBIN: 13.4 g/dL (ref 13.0–17.0)
Immature Granulocytes: 1 %
LYMPHS ABS: 2.3 10*3/uL (ref 0.7–4.0)
Lymphocytes Relative: 36 %
MCH: 27 pg (ref 26.0–34.0)
MCHC: 30.2 g/dL (ref 30.0–36.0)
MCV: 89.5 fL (ref 80.0–100.0)
MONOS PCT: 6 %
Monocytes Absolute: 0.4 10*3/uL (ref 0.1–1.0)
NEUTROS ABS: 3.6 10*3/uL (ref 1.7–7.7)
NEUTROS PCT: 56 %
Platelets: 118 10*3/uL — ABNORMAL LOW (ref 150–400)
RBC: 4.96 MIL/uL (ref 4.22–5.81)
RDW: 13 % (ref 11.5–15.5)
WBC: 6.4 10*3/uL (ref 4.0–10.5)
nRBC: 0 % (ref 0.0–0.2)

## 2018-09-22 LAB — URINALYSIS, MICROSCOPIC (REFLEX)

## 2018-09-22 MED ORDER — ONDANSETRON HCL 4 MG/2ML IJ SOLN
4.0000 mg | Freq: Once | INTRAMUSCULAR | Status: AC
  Administered 2018-09-22: 4 mg via INTRAVENOUS
  Filled 2018-09-22: qty 2

## 2018-09-22 MED ORDER — CIPROFLOXACIN HCL 500 MG PO TABS: 500.0000 mg | ORAL_TABLET | Freq: Two times a day (BID) | ORAL | 0 refills | Status: DC

## 2018-09-22 MED ORDER — OXYCODONE-ACETAMINOPHEN 5-325 MG PO TABS: 2.0000 | ORAL_TABLET | ORAL | 0 refills | Status: AC | PRN

## 2018-09-22 MED ORDER — IOPAMIDOL (ISOVUE-300) INJECTION 61%
100.0000 mL | Freq: Once | INTRAVENOUS | Status: AC | PRN
  Administered 2018-09-22: 100 mL via INTRAVENOUS

## 2018-09-22 MED ORDER — IOPAMIDOL (ISOVUE-300) INJECTION 61%: 100.0000 mL | Freq: Once | INTRAVENOUS | Status: DC

## 2018-09-22 MED ORDER — CIPROFLOXACIN HCL 500 MG PO TABS
500.0000 mg | ORAL_TABLET | Freq: Once | ORAL | Status: AC
  Administered 2018-09-22: 500 mg via ORAL
  Filled 2018-09-22: qty 1

## 2018-09-22 MED ORDER — MORPHINE SULFATE (PF) 4 MG/ML IV SOLN
4.0000 mg | Freq: Once | INTRAVENOUS | Status: AC
  Administered 2018-09-22: 4 mg via INTRAVENOUS
  Filled 2018-09-22: qty 1

## 2018-09-22 MED FILL — CIPROFLOXACIN HCL 500 MG TA: 500 | 14 days supply | Qty: 28 | Fill #0

## 2018-09-22 MED FILL — OXYCODONE-ACETAMINOPHEN 5-3: 5-325 | 2 days supply | Qty: 10 | Fill #0

## 2018-09-22 NOTE — ED Notes (Signed)
Pt/family verbalized understanding of discharge instructions.   

## 2018-09-22 NOTE — ED Provider Notes (Signed)
[facility] EMERGENCY DEPARTMENT Provider Note   CSN: [id number] Arrival date & time: 09/22/18  4098     History   Chief Complaint Chief Complaint  Patient presents with  . Groin Pain    HPI Cory Copeland is a 47 y.o. male.  HPI  This 47 year old male with a history of diabetes hypertension, hyperlipidemia who presents with left lower quadrant pain and mass.  Patient reports history of hernia in this region.  He states that he had an unsuccessful [place] many years ago.  However, he has not had any issues.  Over the last 24 hours he has had increasing pain over the mass and hernia in his left groin.  Reports that his hernia normally is on reduced but does not give him problems.  Reports nausea without vomiting.  Denies any urinary symptoms or fevers.  Rates his pain at 9 out of 10.  He has not taken anything for pain.  Denies any radiation of the pain to his back.  Reports normal bowel movements.  Past Medical History:  Diagnosis Date  . Diabetes mellitus without complication (HCC)   . Gout   . Hyperlipidemia   . Hypertension     Patient Active Problem List   Diagnosis Date Noted  . UTI (urinary tract infection) 02/23/2017  . Acute pyelonephritis 02/23/2017  . Flank pain 02/23/2017    Past Surgical History:  Procedure Laterality Date  . HERNIA REPAIR     right        Home Medications    Prior to Admission medications   Medication Sig Start Date End Date Taking? Authorizing Provider  allopurinol (ZYLOPRIM) 300 MG tablet Take 300 mg by mouth daily.    [provider]  amLODipine (NORVASC) 5 MG tablet Take 5 mg by mouth daily. 12/01/16   [provider]  atorvastatin (LIPITOR) 10 MG tablet Take 10 mg by mouth daily. 02/05/17 02/05/18  [provider]  cephALEXin (KEFLEX) 500 MG capsule Take 1 capsule (500 mg total) by mouth 2 (two) times daily. 02/24/17   Osei-Bonsu, Greggory Stallion, MD  citalopram (CELEXA)  20 MG tablet Take 20 mg by mouth daily.    [provider]  clopidogrel (PLAVIX) 75 MG tablet Take 75 mg by mouth daily.    [provider]  enalapril (VASOTEC) 20 MG tablet Take 20 mg by mouth daily.    [provider]  gemfibrozil (LOPID) 600 MG tablet Take 600 mg by mouth 2 (two) times daily before a meal.    [provider]  glimepiride (AMARYL) 4 MG tablet Take 4 mg by mouth daily with breakfast.    [provider]  hydrochlorothiazide (HYDRODIURIL) 50 MG tablet Take 50 mg by mouth daily.    [provider]  ibuprofen (ADVIL,MOTRIN) 600 MG tablet Take 1 tablet (600 mg total) by mouth every 6 (six) hours as needed for mild pain or moderate pain. 03/01/17   Lavera Guise, MD  indomethacin (INDOCIN) 50 MG capsule Take 50 mg by mouth 2 (two) times daily with a meal.    [provider]  insulin glargine (LANTUS) 100 UNIT/ML injection Inject 30 Units into the skin at bedtime.     [provider]  isosorbide mononitrate (IMDUR) 30 MG 24 hr tablet Take 30 mg by mouth daily.    [provider]  metFORMIN (GLUCOPHAGE) 1000 MG tablet Take 1,000 mg by mouth 2 (two) times daily with a meal.  [provider]  methocarbamol (ROBAXIN) 500 MG tablet Take 1 tablet (500 mg total) by mouth 2 (two) times daily. 03/06/17   Ward, Chase Picket, PA-C  metoprolol (LOPRESSOR) 50 MG tablet Take 50 mg by mouth 2 (two) times daily.    [provider]  naproxen (NAPROSYN) 500 MG tablet Take 1 tablet (500 mg total) by mouth 2 (two) times daily. 03/06/17   Ward, Chase Picket, PA-C  tamsulosin (FLOMAX) 0.4 MG CAPS capsule Take 1 capsule (0.4 mg total) by mouth daily. 02/25/17   Jackie Plum, MD  traZODone (DESYREL) 100 MG tablet Take 100 mg by mouth at bedtime.    [provider]    Family History No family history on file.  Social History Social History   Tobacco Use  . Smoking status: Never Smoker  .  Smokeless tobacco: Never Used  Substance Use Topics  . Alcohol use: No  . Drug use: No     Allergies   Patient has no known allergies.   Review of Systems Review of Systems  Constitutional: Negative for fever.  Respiratory: Negative for shortness of breath.   Cardiovascular: Negative for chest pain.  Gastrointestinal: Positive for abdominal pain and nausea. Negative for constipation, diarrhea and vomiting.  Genitourinary: Negative for dysuria.  All other systems reviewed and are negative.    Physical Exam Updated Vital Signs BP (!) 186/85 (BP Location: Right Arm)   Pulse 74   Temp 97.7 F (36.5 C) (Oral)   Resp 18   Ht 1.727 m (5\' 8" )   Wt 122.5 kg   SpO2 96%   BMI 41.05 kg/m   Physical Exam  Constitutional: He is oriented to person, place, and time. He appears well-developed and well-nourished.  Obese, no acute distress  HENT:  Head: Normocephalic and atraumatic.  Eyes: Pupils are equal, round, and reactive to light.  Cardiovascular: Normal rate, regular rhythm and normal heart sounds.  No murmur heard. Pulmonary/Chest: Effort normal and breath sounds normal. No respiratory distress. He has no wheezes.  Abdominal: Soft. Bowel sounds are normal. There is tenderness. There is no rebound.  Mass noted in the left groin area just anterior to the left testicle in the mons pubis, no overlying skin changes, hernia is partially reducible  Musculoskeletal: He exhibits no edema.  Neurological: He is alert and oriented to person, place, and time.  Skin: Skin is warm and dry.  Psychiatric: He has a normal mood and affect.  Nursing note and vitals reviewed.    ED Treatments / Results  Labs (all labs ordered are listed, but only abnormal results are displayed) Labs Reviewed  URINALYSIS, ROUTINE W REFLEX MICROSCOPIC  CBC WITH DIFFERENTIAL/PLATELET  BASIC METABOLIC PANEL    EKG None  Radiology No results found.  Procedures Procedures (including critical care  time)  Medications Ordered in ED Medications  morphine 4 MG/ML injection 4 mg (has no administration in time range)  ondansetron (ZOFRAN) injection 4 mg (has no administration in time range)     Initial Impression / Assessment and Plan / ED Course  I have reviewed the triage vital signs and the nursing notes.  Pertinent labs & imaging results that were available during my care of the patient were reviewed by me and considered in my medical decision making (see chart for details).     Patient presents with known history of hernia.  Reports prior attempted repair was unsuccessful.  Reports increasing pain over the last 24 hours and nausea.  He is  overall nontoxic-appearing and vital signs are reassuring.  He does have some tenderness over the hernia which is partially reducible.  No overlying skin changes or erythema.  Patient was given pain and nausea medication.  Second attempt at reduction again with partial reduction.  His abdomen is otherwise soft.  Lab work obtained.  Will obtain CT scan to further evaluate.  Final Clinical Impressions(s) / ED Diagnoses   Final diagnoses:  None    ED Discharge Orders    None       , Mayer Masker, MD 09/22/18 [phone]

## 2018-09-22 NOTE — ED Triage Notes (Signed)
Interpreter used for triage [phone]. Reports LLQ/groin pain that started around 1900 last night. Pt reports hx of hernia, with unsuccessful surgery. Reports pain is "unbearable".

## 2018-09-22 NOTE — ED Provider Notes (Signed)
11:37 AM Assumed care from Dr. Wilkie Aye, please see their note for full history, physical and decision making until this point. In brief this is a 47 y.o. year old male who presented to the ED tonight with Groin Pain     Reducible hernia with worsening pain. Pending CT and pain control. If doing ok, fu w/ gen surg.   Patient observed for multiple hours in emergency room the pain is persistently controlled.  CT scan is unchanged from last year.  Urine does show an infection.  On reevaluation patient symptoms he does state he has had some dysuria and back pain.  Possibly could be prostatitis so we will do an extended course of antibiotics.  He will need to follow-up with his primary doctor to make sure that this is improving.  He will follow-up with general surgery for management of his hernia.  He also states that sometimes he has difficulty retracting his foreskin.  He eventually does get it however is becoming more more difficult so he will follow-up with urology.  Discharge instructions, including strict return precautions for new or worsening symptoms, given. Patient and/or family verbalized understanding and agreement with the plan as described.   Labs, studies and imaging reviewed by myself and considered in medical decision making if ordered. Imaging interpreted by radiology.  Labs Reviewed  URINALYSIS, ROUTINE W REFLEX MICROSCOPIC - Abnormal; Notable for the following components:      Result Value   APPearance CLOUDY (*)    Glucose, UA >=500 (*)    Hgb urine dipstick SMALL (*)    Protein, ur 30 (*)    Leukocytes, UA SMALL (*)    All other components within normal limits  CBC WITH DIFFERENTIAL/PLATELET - Abnormal; Notable for the following components:   Platelets 118 (*)    All other components within normal limits  BASIC METABOLIC PANEL - Abnormal; Notable for the following components:   Glucose, Bld 281 (*)    BUN 27 (*)    All other components within normal limits  URINALYSIS,  MICROSCOPIC (REFLEX) - Abnormal; Notable for the following components:   Bacteria, UA MANY (*)    All other components within normal limits  URINE CULTURE    CT ABDOMEN PELVIS W CONTRAST  Final Result      No follow-ups on file.    Marily Memos, MD 09/22/18 1137

## 2018-09-22 NOTE — ED Notes (Signed)
Patient transported to CT 

## 2018-09-23 LAB — URINE CULTURE: Culture: 60000 — AB

## 2018-09-24 ENCOUNTER — Telehealth: Payer: Self-pay | Admitting: Emergency Medicine

## 2018-09-24 NOTE — Telephone Encounter (Signed)
Post ED Visit - Positive Culture Follow-up: Successful Patient Follow-Up  Culture assessed and recommendations reviewed by:  []  Enzo BiNathan Batchelder, Pharm.D. []  Celedonio MiyamotoJeremy Frens, Pharm.D., BCPS AQ-ID []  Garvin FilaMike Maccia, Pharm.D., BCPS []  Georgina PillionElizabeth Martin, [address]Ph[address]D., BCPS []  LonepineMinh Pham, [address]Ph[address]D., BCPS, AAHIVP []  Estella HuskMichelle Turner, Pharm.D., BCPS, AAHIVP []  Lysle Pearlachel Rumbarger, PharmD, BCPS []  Phillips Climeshuy Dang, PharmD, BCPS []  Agapito GamesAlison Masters, PharmD, BCPS []  Verlan FriendsErin Deja, PharmD [facility] Tessin PharmD  Positive urine culture  []  Patient discharged without antimicrobial prescription and treatment is now indicated [x]  Organism is resistant to prescribed ED discharge antimicrobial []  Patient with positive blood cultures  Changes discussed with ED provider: Burna FortsJeff Hedges PA New antibiotic prescription stop Cipro, start cephalexin 500mg  tid x 7 days  Attempting to notify patient    Berle MullMiller, Lamonte Hartt 09/24/2018, 10:13 AM

## 2018-09-24 NOTE — Progress Notes (Signed)
ED Antimicrobial Stewardship Positive Culture Follow Up   Cory Copeland is an 47 y.o. male who [facility] with a chief complaint of  Chief Complaint  Patient presents with  . Groin Pain    Recent Results (from the past 720 hour(s))  Urine culture     Status: Abnormal   Collection Time: 09/22/18  7:54 AM  Result Value Ref Range Status   Specimen Description   Final    URINE, CLEAN CATCH Performed at [facility], [address] [place], [place] [address]    Special Requests   Final    NONE Performed at [facility] [place], [address] [place], [place] [address]    Culture 60,000 COLONIES/mL VIRIDANS STREPTOCOCCUS (A)  Final   Report Status 09/23/2018 FINAL  Final    [x]  Treated with Ciprofloxacin, organism resistant to prescribed antimicrobial  New antibiotic prescription: Cephalexin 500mg  PO TID for 7 days  ED Provider: Burna FortsJeff Hedges, PA-C   Thank you for allowing pharmacy to be a part of this patient's care.  Bradley FerrisJoshua Tessin, PharmD 09/24/2018 8:32 AM PGY-1 Pharmacy Resident Monday - Friday phone -  [phone] Saturday - Sunday phone - [phone]

## 2018-10-05 ENCOUNTER — Emergency Department (HOSPITAL_BASED_OUTPATIENT_CLINIC_OR_DEPARTMENT_OTHER)
Admission: EM | Admit: 2018-10-05 | Discharge: 2018-10-05 | Disposition: A | Discharge status: Home / Self Care | Payer: Medicare Other | Attending: Emergency Medicine | Admitting: Emergency Medicine

## 2018-10-05 ENCOUNTER — Other Ambulatory Visit: Payer: Self-pay

## 2018-10-05 ENCOUNTER — Encounter (HOSPITAL_BASED_OUTPATIENT_CLINIC_OR_DEPARTMENT_OTHER): Payer: Self-pay | Admitting: Adult Health

## 2018-10-05 ENCOUNTER — Emergency Department (HOSPITAL_BASED_OUTPATIENT_CLINIC_OR_DEPARTMENT_OTHER): Payer: Medicare Other

## 2018-10-05 DIAGNOSIS — E119 Type 2 diabetes mellitus without complications: Secondary | ICD-10-CM | POA: Diagnosis not present

## 2018-10-05 DIAGNOSIS — Z794 Long term (current) use of insulin: Secondary | ICD-10-CM | POA: Diagnosis not present

## 2018-10-05 DIAGNOSIS — Z79899 Other long term (current) drug therapy: Secondary | ICD-10-CM | POA: Diagnosis not present

## 2018-10-05 DIAGNOSIS — K4090 Unilateral inguinal hernia, without obstruction or gangrene, not specified as recurrent: Secondary | ICD-10-CM | POA: Insufficient documentation

## 2018-10-05 DIAGNOSIS — I10 Essential (primary) hypertension: Secondary | ICD-10-CM | POA: Insufficient documentation

## 2018-10-05 DIAGNOSIS — M549 Dorsalgia, unspecified: Secondary | ICD-10-CM

## 2018-10-05 DIAGNOSIS — M5442 Lumbago with sciatica, left side: Secondary | ICD-10-CM | POA: Diagnosis not present

## 2018-10-05 DIAGNOSIS — M545 Low back pain: Secondary | ICD-10-CM | POA: Diagnosis present

## 2018-10-05 LAB — URINALYSIS, ROUTINE W REFLEX MICROSCOPIC
Bilirubin Urine: NEGATIVE
Glucose, UA: NEGATIVE mg/dL
KETONES UR: NEGATIVE mg/dL
NITRITE: NEGATIVE
PROTEIN: NEGATIVE mg/dL
Specific Gravity, Urine: 1.025 (ref 1.005–1.030)
pH: 5 (ref 5.0–8.0)

## 2018-10-05 LAB — URINALYSIS, MICROSCOPIC (REFLEX)

## 2018-10-05 MED ORDER — ONDANSETRON HCL 4 MG/2ML IJ SOLN
4.0000 mg | Freq: Once | INTRAMUSCULAR | Status: AC
  Administered 2018-10-05: 4 mg via INTRAVENOUS
  Filled 2018-10-05: qty 2

## 2018-10-05 MED ORDER — CIPROFLOXACIN HCL 500 MG PO TABS: 500.0000 mg | ORAL_TABLET | Freq: Two times a day (BID) | ORAL | 0 refills | Status: AC

## 2018-10-05 MED ORDER — CYCLOBENZAPRINE HCL 10 MG PO TABS: 10.0000 mg | ORAL_TABLET | Freq: Two times a day (BID) | ORAL | 0 refills | Status: AC | PRN

## 2018-10-05 MED ORDER — KETOROLAC TROMETHAMINE 30 MG/ML IJ SOLN
30.0000 mg | Freq: Once | INTRAMUSCULAR | Status: AC
  Administered 2018-10-05: 30 mg via INTRAVENOUS
  Filled 2018-10-05: qty 1

## 2018-10-05 NOTE — ED Triage Notes (Signed)
Interpreter phone used for Spanish, "2 weeks ago I came here for pain and infection in my prostate, that is gone but now I have pain in my lower back that starts in my hips and goes down into my legs. This pain began a while ago but last night it bacame worse and I was unable to sleep or move in the bed because it hurt more. I did not take any medications for the pain"

## 2018-10-05 NOTE — Discharge Instructions (Addendum)
Continue taking Cipro as directed.  You can take Tylenol or Ibuprofen as directed for pain. You can alternate Tylenol and Ibuprofen every 4 hours. If you take Tylenol at 1pm, then you can take Ibuprofen at 5pm. Then you can take Tylenol again at 9pm.   Take Robaxin as prescribed. This medication will make you drowsy so do not drive or drink alcohol when taking it.  Follow-up with urology as previously arranged.  Return to the Emergency Department immediately for any worsening back pain, neck pain, difficulty walking, numbness/weaknss of your arms or legs, urinary or bowel accidents, fever or any other worsening or concerning symptoms.      Contine tomando Cipro segn las indicaciones.  Puede tomar Tylenol o Ibuprofeno segn las indicaciones para el dolor. Puede alternar Tylenol e Ibuprofeno cada 4 horas. Si toma Tylenol a la 1 p.m., puede tomar Ibuprofeno a las 5 p.m. Luego puede tomar Tylenol nuevamente a las 9 p.m.  Tome Robaxin segn lo prescrito. Este medicamento lo adormecer, as que no conduzca ni beba alcohol cuando lo tome.  Seguimiento con urologa segn lo acordado previamente.  Regrese al Departamento de Emergencia de inmediato por cualquier empeoramiento del dolor de espalda, dolor de cuello, dificultad para caminar, entumecimiento / debilidad de brazos o piernas, accidentes urinarios o intestinales, fiebre o cualquier otro empeoramiento o sntomas relacionados.

## 2018-10-05 NOTE — ED Notes (Signed)
Patient transported to CT 

## 2018-10-05 NOTE — ED Provider Notes (Signed)
[facility] EMERGENCY DEPARTMENT Provider Note   CSN: [id number] Arrival date & time: 10/05/18  0920     History   Chief Complaint No chief complaint on file.   HPI Cory Copeland is a 47 y.o. male presents for evaluation of left lower back pain that began a few days ago.  He states the pain radiates back into his left hip and his left leg.  He denies any preceding trauma, injury, fall.  He does report he is able to walk but reports worsening pain in his leg when he ambulates.  He states that he has not taken any medication for the pain.  His pain is worse with movement or bending.  Patient states that he was recently seen in the ED for evaluation of groin pain.  He had an unchanged hernia but did see evidence of infection his urine was started on antibiotic which he states only has 1 more day left.  Patient states that he was instructed to follow-up with urology but has not made an appointment yet.  Patient states that he has not had any dysuria, hematuria, pain or swelling of his testicles.  He states that this pain is not connected to his groin and states that his hernia is not bothering him. Denies fevers, weight loss, numbness/weakness of upper and lower extremities, bowel/bladder incontinence, saddle anesthesia, history of back surgery, history of IVDA.    The history is provided by the patient.    Past Medical History:  Diagnosis Date  . Diabetes mellitus without complication (HCC)   . Gout   . Hyperlipidemia   . Hypertension     Patient Active Problem List   Diagnosis Date Noted  . UTI (urinary tract infection) 02/23/2017  . Acute pyelonephritis 02/23/2017  . Flank pain 02/23/2017    Past Surgical History:  Procedure Laterality Date  . HERNIA REPAIR     right        Home Medications    Prior to Admission medications   Medication Sig Start Date End Date Taking? Authorizing Provider  allopurinol (ZYLOPRIM) 300 MG tablet Take 300 mg by mouth daily.     [provider]  amLODipine (NORVASC) 5 MG tablet Take 5 mg by mouth daily. 12/01/16   [provider]  atorvastatin (LIPITOR) 10 MG tablet Take 10 mg by mouth daily. 02/05/17 02/05/18  [provider]  cephALEXin (KEFLEX) 500 MG capsule Take 1 capsule (500 mg total) by mouth 2 (two) times daily. 02/24/17   Jackie Plumsei-Bonsu, George, MD  ciprofloxacin (CIPRO) 500 MG tablet Take 1 tablet (500 mg total) by mouth every 12 (twelve) hours for 14 days. 10/05/18 10/19/18  Maxwell CaulLayden, Tatsuya Okray A, PA-C  citalopram (CELEXA) 20 MG tablet Take 20 mg by mouth daily.    [provider]  clopidogrel (PLAVIX) 75 MG tablet Take 75 mg by mouth daily.    [provider]  cyclobenzaprine (FLEXERIL) 10 MG tablet Take 1 tablet (10 mg total) by mouth 2 (two) times daily as needed for muscle spasms. 10/05/18   Graciella FreerLayden, Korben Carcione A, PA-C  enalapril (VASOTEC) 20 MG tablet Take 20 mg by mouth daily.    [provider]  gabapentin (NEURONTIN) 100 MG capsule Take 100 mg by mouth 3 (three) times daily.    [provider]  gemfibrozil (LOPID) 600 MG tablet Take 600 mg by mouth 2 (two) times daily before a meal.    [provider]  glimepiride (AMARYL) 4 MG tablet Take 4 mg  by mouth daily with breakfast.    [provider]  hydrochlorothiazide (HYDRODIURIL) 50 MG tablet Take 50 mg by mouth daily.    [provider]  ibuprofen (ADVIL,MOTRIN) 600 MG tablet Take 1 tablet (600 mg total) by mouth every 6 (six) hours as needed for mild pain or moderate pain. 03/01/17   Lavera Guise, MD  indomethacin (INDOCIN) 50 MG capsule Take 50 mg by mouth 2 (two) times daily with a meal.    [provider]  insulin glargine (LANTUS) 100 UNIT/ML injection Inject 70 Units into the skin at bedtime.     [provider]  isosorbide mononitrate (IMDUR) 30 MG 24 hr tablet Take 30 mg by mouth daily.    [provider]  metFORMIN (GLUCOPHAGE) 1000 MG tablet  Take 1,000 mg by mouth 2 (two) times daily with a meal.    [provider]  methocarbamol (ROBAXIN) 500 MG tablet Take 1 tablet (500 mg total) by mouth 2 (two) times daily. 03/06/17   Ward, Chase Picket, PA-C  metoprolol (LOPRESSOR) 50 MG tablet Take 50 mg by mouth 2 (two) times daily.    [provider]  naproxen (NAPROSYN) 500 MG tablet Take 1 tablet (500 mg total) by mouth 2 (two) times daily. 03/06/17   Ward, Chase Picket, PA-C  oxyCODONE-acetaminophen (PERCOCET) 5-325 MG tablet Take 2 tablets by mouth every 4 (four) hours as needed. 09/22/18   Mesner, Barbara Cower, MD  tamsulosin (FLOMAX) 0.4 MG CAPS capsule Take 1 capsule (0.4 mg total) by mouth daily. 02/25/17   Jackie Plum, MD  traZODone (DESYREL) 100 MG tablet Take 100 mg by mouth at bedtime.    [provider]    Family History History reviewed. No pertinent family history.  Social History Social History   Tobacco Use  . Smoking status: Never Smoker  . Smokeless tobacco: Never Used  Substance Use Topics  . Alcohol use: No  . Drug use: No     Allergies   Patient has no known allergies.   Review of Systems Review of Systems  Constitutional: Negative for fever.  Respiratory: Negative for shortness of breath.   Cardiovascular: Negative for chest pain.  Gastrointestinal: Negative for abdominal pain, nausea and vomiting.  Genitourinary: Negative for dysuria and hematuria.  Musculoskeletal: Positive for back pain.  Neurological: Negative for weakness, numbness and headaches.  All other systems reviewed and are negative.    Physical Exam Updated Vital Signs BP 120/70 (BP Location: Right Arm)   Pulse 78   Temp 98.1 F (36.7 C) (Oral)   Resp 18   Ht 5\' 8"  (1.727 m)   Wt 122.8 kg   SpO2 95%   BMI 41.16 kg/m   Physical Exam  Constitutional: He appears well-developed and well-nourished.  HENT:  Head: Normocephalic and atraumatic.  Eyes: Conjunctivae and EOM are normal. Right eye  exhibits no discharge. Left eye exhibits no discharge. No scleral icterus.  Neck: Full passive range of motion without pain.  Full flexion/extension and lateral movement of neck fully intact. No bony midline tenderness. No deformities or crepitus.   Pulmonary/Chest: Effort normal.  Abdominal: A hernia is present. Hernia confirmed positive in the left inguinal area.  Genitourinary: Testes normal and penis normal. Right testis shows no swelling and no tenderness. Left testis shows no swelling and no tenderness.  Genitourinary Comments: The exam was performed with a chaperone present.  No swelling, tenderness, erythema noted bilateral testicles.  He has a large left inguinal hernia that is  easily reducible with no surrounding warmth, erythema.  Musculoskeletal:       Thoracic back: He exhibits no tenderness.       Back:  Neurological: He is alert.  Follows commands, Moves all extremities  5/5 strength to BUE and BLE  Sensation intact throughout all major nerve distributions Positive SLR on the left.   Skin: Skin is warm and dry.  Psychiatric: He has a normal mood and affect. His speech is normal and behavior is normal.  Nursing note and vitals reviewed.    ED Treatments / Results  Labs (all labs ordered are listed, but only abnormal results are displayed) Labs Reviewed  URINALYSIS, ROUTINE W REFLEX MICROSCOPIC - Abnormal; Notable for the following components:      Result Value   Hgb urine dipstick TRACE (*)    Leukocytes, UA SMALL (*)    All other components within normal limits  URINALYSIS, MICROSCOPIC (REFLEX) - Abnormal; Notable for the following components:   Bacteria, UA MANY (*)    All other components within normal limits    EKG None  Radiology Ct L-spine No Charge  Result Date: 10/05/2018 CLINICAL DATA:  Left flank pain EXAM: CT lumbar spine without contrast TECHNIQUE: Multiplanar CT images of the lumbar spine were reconstructed from contemporary CT of the abdomen  CONTRAST:  None COMPARISON:  Abdominal CT from 09/22/2018 FINDINGS: Alignment: Normal. Vertebrae: No acute fracture or focal pathologic process. Paraspinal and other soft tissues: Described separately. No acute perispinal process. Disc levels: Generalized preserved disc height with spondylitic spurring. Mild generalized facet spurring. No evident herniation. IMPRESSION: 1. No acute finding or evident impingement. 2. Generalized spondylosis and mild facet spurring. Electronically Signed   By: Marnee Spring M.D.   On: 10/05/2018 12:07   Ct Renal Stone Study  Result Date: 10/05/2018 CLINICAL DATA:  Left flank pain this more EXAM: CT ABDOMEN AND PELVIS WITHOUT CONTRAST TECHNIQUE: Multidetector CT imaging of the abdomen and pelvis was performed following the standard protocol without IV contrast. COMPARISON:  09/22/2018 FINDINGS: Lower chest:  No contributory findings. Hepatobiliary: No focal liver abnormality.No evidence of biliary obstruction or stone. Pancreas: Unremarkable. Spleen: Unremarkable. Adrenals/Urinary Tract: Negative adrenals. Chronic right hydroureteronephrosis with tortuous ureter looping through the repaired right inguinal hernia site. Renal cortical thinning on the right. Cystic density from the lower pole left kidney. No left hydronephrosis or urolithiasis. Narrowing of the bladder from degree of pelvic fat. Stomach/Bowel:  No obstruction. No appendicitis. Vascular/Lymphatic: No acute vascular abnormality. No mass or adenopathy. Reproductive:No pathologic findings. Other: Large left fatty inguinal hernia that is partially covered. Right inguinal hernia with repair. Musculoskeletal: No acute or aggressive finding. IMPRESSION: 1. No acute finding.  No left hydronephrosis or urolithiasis. 2. Chronic large fatty left inguinal hernia. 3. Chronic right hydroureteronephrosis and cortical thinning. The right ureter chronically loops through the repaired right inguinal hernia. Recommend outpatient  urology opinion if not previously obtained. Electronically Signed   By: Marnee Spring M.D.   On: 10/05/2018 12:28    Procedures Procedures (including critical care time)  Medications Ordered in ED Medications  ketorolac (TORADOL) 30 MG/ML injection 30 mg (30 mg Intravenous Given 10/05/18 1101)  ondansetron (ZOFRAN) injection 4 mg (4 mg Intravenous Given 10/05/18 1101)     Initial Impression / Assessment and Plan / ED Course  I have reviewed the triage vital signs and the nursing notes.  Pertinent labs & imaging results that were available during my care of the patient were reviewed by me and considered  in my medical decision making (see chart for details).     47 year old male who presents for evaluation left lower back pain that is been ongoing for last few days.  No preceding trauma, injury, fall.  He states it radiates out into his leg.  Was recently seen here for groin pain.  CT showed unchanged hernia.  He did have infected urine and was treated for possible prostatitis.  He reports he has been taking Cipro which his last dose is today.  No dysuria, hematuria, numbness/weakness arms or legs, fevers.  No red flag symptoms.  No neuro deficits. Patient is afebrile, non-toxic appearing, sitting comfortably on examination table. Vital signs reviewed and stable.  Tenderness palpation noted left paraspinal muscles that extends into the gluteus.  Positive straight leg raise.  Consider muscular skeletal pain versus mechanical back pain.  Low suspicion for UTI or kidney stone but will evaluate urine.  On GU exam, he does have a left internal hernia but it is soft, not erythematous, warmth or hard.  Is easily reducible.  UA shows trace hemoglobin, small leukocytes, pyuria.  There is some squamous epithelium so question of this is contaminant.  CT renal study shows no evidence of kidney stone.  No acute finding.  Noted large fatty left inguinal hernia that is chronic.  Reevaluation after  analgesics.  He reports improvement in pain.  He was able to get up and take a few steps without difficulty.  He is able to move his legs slightly more than he was.  I suspect this most likely muscular skeletal pain versus sciatica.  History/physical exam not concerning for cauda equina, spinal abscess. At this time, patient exhibits no emergent life-threatening condition that require further evaluation in ED. Given concerns for prostatitis, will increase his antibiotic dosage and urine culture sent. Patient had ample opportunity for questions and discussion. All patient's questions were answered with full understanding. Strict return precautions discussed. Patient expresses understanding and agreement to plan.   Final Clinical Impressions(s) / ED Diagnoses   Final diagnoses:  Back pain  Acute left-sided low back pain with left-sided sciatica    ED Discharge Orders         Ordered    cyclobenzaprine (FLEXERIL) 10 MG tablet  2 times daily PRN     10/05/18 1312    ciprofloxacin (CIPRO) 500 MG tablet  Every 12 hours     10/05/18 1312           Maxwell Caul, PA-C 10/05/18 1959    Alvira Monday, MD 10/08/18 1014
